# Patient Record
Sex: Female | Born: 1935 | Race: White | Hispanic: No | Smoking: Never smoker
Health system: Southern US, Community
[De-identification: ages and names within clinical notes are randomized; demographics above are authoritative.]

## PROBLEM LIST (undated history)

## (undated) DIAGNOSIS — I10 Essential (primary) hypertension: Secondary | ICD-10-CM

## (undated) DIAGNOSIS — F32A Depression, unspecified: Secondary | ICD-10-CM

## (undated) DIAGNOSIS — F329 Major depressive disorder, single episode, unspecified: Secondary | ICD-10-CM

## (undated) HISTORY — PX: CHOLECYSTECTOMY: SHX55

## (undated) HISTORY — PX: ABDOMINAL HYSTERECTOMY: SHX81

## (undated) HISTORY — PX: DILATION AND CURETTAGE OF UTERUS: SHX78

## (undated) HISTORY — PX: THROAT SURGERY: SHX803

---

## 2007-11-15 ENCOUNTER — Encounter: Admission: RE | Admit: 2007-11-15 | Discharge: 2007-11-15 | Payer: Self-pay | Admitting: Orthopedic Surgery

## 2008-05-26 ENCOUNTER — Encounter: Admission: RE | Admit: 2008-05-26 | Discharge: 2008-07-27 | Payer: Self-pay | Admitting: Orthopedic Surgery

## 2008-07-27 ENCOUNTER — Inpatient Hospital Stay (HOSPITAL_COMMUNITY): Admission: RE | Admit: 2008-07-27 | Discharge: 2008-07-31 | Payer: Self-pay | Admitting: Orthopedic Surgery

## 2009-04-13 ENCOUNTER — Inpatient Hospital Stay (HOSPITAL_COMMUNITY): Admission: RE | Admit: 2009-04-13 | Discharge: 2009-04-16 | Payer: Self-pay | Admitting: Orthopedic Surgery

## 2010-09-16 ENCOUNTER — Encounter
Admission: RE | Admit: 2010-09-16 | Discharge: 2010-09-16 | Payer: Self-pay | Source: Home / Self Care | Attending: Orthopedic Surgery | Admitting: Orthopedic Surgery

## 2010-09-30 ENCOUNTER — Encounter
Admission: RE | Admit: 2010-09-30 | Discharge: 2010-09-30 | Payer: Self-pay | Source: Home / Self Care | Attending: Orthopedic Surgery | Admitting: Orthopedic Surgery

## 2010-10-11 ENCOUNTER — Encounter
Admission: RE | Admit: 2010-10-11 | Discharge: 2010-11-08 | Payer: Self-pay | Source: Home / Self Care | Attending: Orthopedic Surgery | Admitting: Orthopedic Surgery

## 2010-11-10 ENCOUNTER — Ambulatory Visit: Payer: Medicare Other | Admitting: Physical Therapy

## 2010-11-10 ENCOUNTER — Ambulatory Visit: Payer: Medicare Other | Attending: Orthopedic Surgery | Admitting: Physical Therapy

## 2010-11-10 DIAGNOSIS — M6281 Muscle weakness (generalized): Secondary | ICD-10-CM | POA: Insufficient documentation

## 2010-11-10 DIAGNOSIS — IMO0001 Reserved for inherently not codable concepts without codable children: Secondary | ICD-10-CM | POA: Insufficient documentation

## 2010-11-10 DIAGNOSIS — M25539 Pain in unspecified wrist: Secondary | ICD-10-CM | POA: Insufficient documentation

## 2010-11-10 DIAGNOSIS — M2560 Stiffness of unspecified joint, not elsewhere classified: Secondary | ICD-10-CM | POA: Insufficient documentation

## 2010-11-15 ENCOUNTER — Ambulatory Visit: Payer: Medicare Other | Admitting: Physical Therapy

## 2010-11-18 ENCOUNTER — Ambulatory Visit: Payer: Medicare Other | Admitting: Physical Therapy

## 2010-11-22 ENCOUNTER — Ambulatory Visit: Payer: Medicare Other | Admitting: Physical Therapy

## 2011-01-15 LAB — CBC
Hemoglobin: 9.9 g/dL — ABNORMAL LOW (ref 12.0–15.0)
MCHC: 32.3 g/dL (ref 30.0–36.0)
Platelets: 126 10*3/uL — ABNORMAL LOW (ref 150–400)
Platelets: 162 10*3/uL (ref 150–400)
RDW: 14.8 % (ref 11.5–15.5)
RDW: 14.9 % (ref 11.5–15.5)

## 2011-01-15 LAB — TYPE AND SCREEN: ABO/RH(D): A NEG

## 2011-01-15 LAB — BASIC METABOLIC PANEL
BUN: 14 mg/dL (ref 6–23)
BUN: 15 mg/dL (ref 6–23)
CO2: 26 mEq/L (ref 19–32)
Calcium: 8.3 mg/dL — ABNORMAL LOW (ref 8.4–10.5)
Calcium: 8.6 mg/dL (ref 8.4–10.5)
GFR calc non Af Amer: 37 mL/min — ABNORMAL LOW (ref >60)
Glucose, Bld: 107 mg/dL — ABNORMAL HIGH (ref 70–99)
Glucose, Bld: 84 mg/dL (ref 70–99)
Sodium: 137 mEq/L (ref 135–145)
Sodium: 138 mEq/L (ref 135–145)

## 2011-01-16 LAB — DIFFERENTIAL
Basophils Absolute: 0.1 10*3/uL (ref 0.0–0.1)
Lymphocytes Relative: 26 % (ref 12–46)
Monocytes Absolute: 0.4 10*3/uL (ref 0.1–1.0)
Monocytes Relative: 5 % (ref 3–12)
Neutro Abs: 4.9 10*3/uL (ref 1.7–7.7)

## 2011-01-16 LAB — URINALYSIS, ROUTINE W REFLEX MICROSCOPIC
Nitrite: NEGATIVE
Specific Gravity, Urine: 1.002 — ABNORMAL LOW (ref 1.005–1.030)
Urobilinogen, UA: 0.2 mg/dL (ref 0.0–1.0)

## 2011-01-16 LAB — BASIC METABOLIC PANEL
Calcium: 9.5 mg/dL (ref 8.4–10.5)
GFR calc Af Amer: 43 mL/min — ABNORMAL LOW (ref >60)
GFR calc non Af Amer: 36 mL/min — ABNORMAL LOW (ref >60)
Sodium: 138 mEq/L (ref 135–145)

## 2011-01-16 LAB — CBC
Hemoglobin: 12.5 g/dL (ref 12.0–15.0)
RBC: 3.96 MIL/uL (ref 3.87–5.11)
RDW: 14.9 % (ref 11.5–15.5)

## 2011-01-16 LAB — APTT: aPTT: 29 seconds (ref 24–37)

## 2011-02-21 NOTE — Op Note (Signed)
Carla [name], Carla [name]                ACCOUNT NO.:  [id number]   MEDICAL RECORD NO.:  [id number]          PATIENT TYPE:  INP   LOCATION:  0009                         FACILITY:  [facility]   PHYSICIAN:  Madlyn Frankel. Charlann Boxer, M.D.  DATE OF BIRTH:  [birth year]   DATE OF PROCEDURE:  07/27/2008  DATE OF DISCHARGE:                               OPERATIVE REPORT   PREOPERATIVE DIAGNOSIS:  Right knee osteoarthritis.   POSTOPERATIVE DIAGNOSIS:  Right knee osteoarthritis.   PROCEDURE:  Right total knee replacement utilizing the DePuy rotating  platform posterior stabilized knee system size 3 femur, 3 tibia, a 12.5  mm insert and a 35 patellar button.   SURGEON:  Madlyn Frankel. Charlann Boxer, M.D.   ASSISTANT:  Yetta Glassman. Loreta Ave, PA.   ANESTHESIA:  Spinal.   DRAINS:  Times one.   COMPLICATIONS:  None.   SPECIMENS:  None.   FINDINGS:  None.   TOURNIQUET TIME:  34 minutes at 250 mmHg.   INDICATIONS FOR THE PROCEDURE:  Ms. Bacha is a 75 year old female who I  have been following in the office over the past year or so with  bilateral knee osteoarthritis, right symptomatically worse than left.  She had failed conservative measures including injections, viscus  supplementation.  At this point she had enough of a reduction of quality  of life that she decided to opt for arthroplasty.  We reviewed the  risks, benefits, pros and cons of this procedure had set her up for some  preoperative physical therapy to assist in her overall recovery.  The  risks of infection, DVT, component failure, stiffness were all discussed  and the need for revision surgery was also reviewed.  Postoperative  course expectations discussed.  Consent was obtained for benefit of pain  relief.   PROCEDURE IN DETAIL:  The patient was brought to operative theater.  Once adequate anesthesia, preoperative antibiotics, Ancef, administered  the patient was positioned supine with a right thigh tourniquet placed.  The right lower extremity was  pre-scrubbed, prepped and draped in  sterile fashion.  A time-out was carried out identifying the proper  patient and extremity.  Leg was exsanguinated, tourniquet elevated.  A  midline incision was made.  A median arthrotomy was created and  following initial debridement attention was directed to the patella.  Precut measure was 22 mm.  I resected down to 13 and 14.  I used a 35  patellar button.  The patient had a large osteophyte on the lateral  aspect of the patella which was debrided back to stable level.   We kept the patella button in place to protect the cut surface for the  remainder of the case.   Attention was now directed to the femur.  Femoral canal was opened.  Irrigated to prevent fat emboli.  The intramedullary rod was placed and  at the 3 degrees of valgus I resected 10 mm of bone off the distal  femur.   Following this cut, attention was directed to the tibia.  The tibia was  subluxated anteriorly and extramedullary rod placed.  I removed the  meniscus and the cruciate stumps.  I resected 10 mm of bone off the  proximal tibia based off the lateral side.  Following this I checked  with an extension block and determined that I was going to be used a  12.5 insert which got out to full extension.  I removed the pins holding  the extramedullary guide in place and now attended back to the femur.  We checked the cut surface of tibia to assure that it was perpendicular  in both planes.  Once this was confirmed I sized the femur to be a size  3, placed the rotation block and then pinned it in position based on the  rotation set by the cut surface of the proximal tibia using the  appropriate guide.  I then pinned the 4-in-1 cutting block in place,  checked to make sure there was going to be no notching.  The anterior,  posterior and chamfer cuts were then made without difficulty.  The box  cut was then made off the lateral aspect of the distal femur.   Attention was now  directed back to the tibia.  I used a size 3 tibial  tray which sat best on the cut surface assuring there would be no early  subsidence.  It was resting mainly on cortical bone.  I drilled the  keel, punched it and did a trial reduction.  Again, we were happy with a  12.5 insert from extension to flexion without evidence of any  instability.  The patella was noted to track without application of  pressure.   At this point, all trial components were removed.  The synovial capsule  junction was injected with 60 mL of 0.25% of Marcaine with epinephrine  and 1 mL of Toradol.  The knee was irrigated with pulse lavage of normal  saline solution.  Final components opened and cement mixed.   Final components were cemented into position.  The knee was brought to  extension with 12.5 insert.  Extruded cement removed.  Once cement  cured, excessive cement was removed throughout the knee.  The final 12.5  insert to match the 3 femur was then inserted without difficulty  following irrigation.   The knee was then irrigated with pulse lavage again.  A medium Hemovac  drain was placed deep.  The extensor mechanism was then reapproximated  using #1 Vicryl with the knee in flexion.  The remaining wound was  closed in layers with 2-0 Vicryl and staples on the skin due to the very  thin nature of her epidermal layer.   The patient's knee was then cleaned, dried and dressed sterilely in a  sterile bulky wrap.  She was brought to the recovery room in stable  condition and tolerated the procedure well.      Madlyn Frankel Charlann Boxer, M.D.  Electronically Signed     MDO/MEDQ  D:  07/27/2008  T:  07/27/2008  Job:  [id number]

## 2011-02-21 NOTE — Discharge Summary (Signed)
NAMEVARNIKA, Carla [name]                ACCOUNT NO.:  [id number]   MEDICAL RECORD NO.:  [id number]          PATIENT TYPE:  INP   LOCATION:  1603                         FACILITY:  [facility]   PHYSICIAN:  Madlyn Frankel. Charlann Boxer, M.D.  DATE OF BIRTH:  11-02-1935   DATE OF ADMISSION:  07/27/2008  DATE OF DISCHARGE:  07/31/2008                               DISCHARGE SUMMARY   ADMITTING DIAGNOSES:  1. Osteoarthritis.  2. Stroke.  3. Hypertension.  4. Hypercholesteremia.  5. Chronic renal insufficiency.   DISCHARGE DIAGNOSES:  1. Osteoarthritis.  2. Stroke.  3. Hypertension.  4. Hypercholesteremia.  5. Chronic renal insufficiency.   HISTORY OF PRESENT ILLNESS:  A 75 year old female with a history of  right knee pain secondary to osteoarthritis that was refractory to all  conservative treatment.  She also has a significant history of chronic  renal insufficiency.  She has multiple food restrictions that are  extensive in nature.  Please see addendum documentation for very  specific details.   CONSULTATIONS:  None.   PROCEDURE:  Right total knee replaced by surgeon, Dr. Durene Romans,  assistant, Dwyane Luo, PA-C.   LABORATORY DATA:  CBC final reading; white blood cells 9.8, hemoglobin  9.1, hematocrit 27.1, platelets 143.  Metabolic panel tracked throughout  the course of stay, final reading - sodium 138, potassium 4.4,  creatinine 1.7, glucose 56.   Calcium, final reading 8.6.   Coags all within normal limits.  UA negative for nitrites.  There were a  few bacteria though.   Radiology not found in chart.  Cardiology not found in chart.   HOSPITAL COURSE:  The patient underwent right total knee replacement.  She has made slow progress during the course of stay.  She has remained  hemodynamically stable, although she does have chronic renal disease.  Her dressing was changed on a daily basis.  After day one, wound with no  significant drainage.  Staples were used.  She had improving quad  function during her stay and by day #4, she was able to do a straight-  leg raise with extreme effort.  Due to her slow progress, she was  agreeable to a short-term stay at a skilled nursing facility and rehab  facility.  When seen on day #3, she did have some coughing and it  persisted somewhat into day #4.  When I saw her, she was sleepy but  doing okay.  Pain was well-controlled with improved progress with  physical therapy the prior day.  She had a little bit of a low glucose,  but was corrected with just some soda and juice.   DISCHARGE DISPOSITION:  Discharge to rehab facility in stable and  improved condition.   DISCHARGE DIET:  Regular.   DISCHARGE WOUND CARE:  Keep dry.   DISCHARGE PHYSICAL THERAPY:  Weightbearing as tolerated with the use of  a rolling walker.  Goals of physical therapy will be to maximize range  of motion, increased strength, work on gait retraining and encourage  independence in activities of daily living.   DISCHARGE MEDICATIONS:  1. Lovenox 40 mg  subcu q.24 x10 days.  2. Robaxin 500 mg p.o. q.6 p.r.n. muscle spasm or pain.  3. Iron 325 mg 1 p.o. t.i.d. x3 weeks.  4. Colace 100 mg p.o. b.i.d. p.r.n. constipation while on narcotics.  5. MiraLax 17 gm p.o. daily p.r.n. constipation while on narcotics.  6. Enteric-coated aspirin 325 mg 1 p.o. daily x4 weeks after      completion of Lovenox.  7. Oxycodone 5 mg 1-2 p.o. q.3-4 p.r.n. pain.  8. Sodium bicarbonate 650 mg 2 tablets b.i.d.  9. Meclizine 25 mg p.o. b.i.d.  10.Simvastatin 20 mg p.o. nightly.  11.Allopurinol 300 mg p.o. daily.  12.Diovan 160 mg p.o. daily.  13.Furosemide 40 mg p.r.n.  14.Acetaminophen p.r.n.  15.Multivitamin p.o. daily.  16.Vitamin D3 1000 units b.i.d.  17.Vitamin C 500 mg b.i.d.  18.Vitamin B12 shot, she gets on a monthly basis.   DISCHARGE FOLLOWUP:  Follow up with Dr. Charlann Boxer at phone number [phone]  for an appointment for stable removal and wound check.      ______________________________  Yetta Glassman. Loreta Ave, [place]      Madlyn Frankel. Charlann Boxer, M.D.  Electronically Signed    BLM/MEDQ  D:  07/31/2008  T:  07/31/2008  Job:  [id number]   cc:   Dalbert Mayotte, M.D.

## 2011-02-21 NOTE — Discharge Summary (Signed)
Carla [name], Carla [name]                ACCOUNT NO.:  [id number]   MEDICAL RECORD NO.:  [id number]          PATIENT TYPE:  INP   LOCATION:  1613                         FACILITY:  [facility]   PHYSICIAN:  Madlyn Frankel. Charlann Boxer, M.D.  DATE OF BIRTH:  [birth year]   DATE OF ADMISSION:  04/13/2009  DATE OF DISCHARGE:  04/16/2009                               DISCHARGE SUMMARY   ADMISSION DIAGNOSES:  1. Osteoarthritis.  2. Stroke.  3. Hypertension.  4. Hypercholesterolemia.  5. Chronic renal insufficiency.  6. Hysterectomy.  7. Gastric bypass surgery 24 years ago.   DISCHARGE DIAGNOSES:  1. Osteoarthritis.  2. Stroke.  3. Hypertension.  4. Hypercholesterolemia.  5. Chronic renal insufficiency.  6. Hysterectomy.  7. Gastric bypass surgery 24 years ago.   LABORATORY DATA:  CBC final reading showed her white blood cell count of  7.8, hematocrit 28.6, platelets 126.  Metabolic:  Sodium 137, potassium  4.5, BUN 14, creatinine 1.4, glucose 84.   CONSULTS:  None.   PROCEDURE:  Left total knee replacement by surgeon Dr. Durene Romans,  assistant Dwyane Luo, P.A.-C.   HOSPITAL COURSE:  The patient underwent left total knee replacement and  was admitted to the orthopedic floor.  She was planned for skilled  nursing facility rehabilitation at discharge.  She did have some nausea  the first day.  We changed her pain medicine over to oxycodone.  Due to  some high potassium, we put her on normal saline.  The next day, pain  was better.  She did have a little bit of rough evening.  We maintained  her Foley due to low urinary output and gave her boluses to restabilize.  She did well with physical therapy although she had unlimited ability to  ambulate long distances, only going approximately 20 feet by the third  day.  She was making progress with improving quad function.  Dressing  was changed.  No significant drainage from the wound.  She was afebrile,  hemodynamically and orthopedically stable, ready for  discharge to  skilled nursing facility rehabilitation for further progress with  physical therapy.   DISCHARGE DISPOSITION:  Discharged to skilled nursing facility  rehabilitation in stable and improved condition.   DISCHARGE DIET:  Heart healthy.  She has extensive dietary needs.  The  patient is a former Engineer, civil (consulting), very pleasant, and she can give you full  details of her extensive dietary needs.  Basically, she has shrunken  kidneys and therefore requires small and frequent rehydration.  Again,  ask patient for details of her dietary needs, and please help her in  this area.   DISCHARGE WOUND CARE:  Keep wound dry.   DISCHARGE PHYSICAL THERAPY:  She is weight bearing as tolerated with use  of a rolling walker.  Goals of physical therapy will be 0 to 120 degrees  by 6 weeks.   DISCHARGE MEDICATIONS:  1. Oxycodone 5 mg 1-3 p.o. q.2-4 h. p.r.n. pain.  2. Tylenol 325 mg to 650 mg p.o. q.6 h. p.r.n. pain.  May be given in      conjunction with oxycodone.  3. Robaxin 500 mg 1 p.o. q.6 h. p.r.n. muscle spasm pain.  4. Iron 325 mg 1 p.o. t.i.d. x2 weeks.  5. Colace 100 mg 1 p.o. b.i.d. p.r.n. pain.  6. MiraLax 17 g p.o. daily p.r.n. pain.  7. Lovenox 40 mg subcutaneous q.24 h. for 10 days.  8. Start enteric-coated aspirin 325 mg 1 p.o. daily after Lovenox      completed.  This is for 4 weeks.  9. Meclizine 25 mg p.o. b.i.d.  10.Simvastatin 20 mg p.o. every night.  11.Diovan 160 mg p.o. every night.  12.Allopurinol 300 mg p.o. every night.  13.Furosemide 40 mg p.r.n.  14.Aspirin 81 mg, hold, see note on other aspirin; he may resume the      81-mg aspirin after done with the 325.  15.Vitamin D 3000 units b.i.d.  16.Multivitamin 2 tablets chewable twice a day.  17.Vitamin C chewable 500 mg 2 tablets twice a day.   DISCHARGE FOLLOWUP:  Follow up with Dr. Charlann Boxer at phone number [phone] in  2 weeks for wound check.     ______________________________  Yetta Glassman. Loreta Ave, [place]       Madlyn Frankel. Charlann Boxer, M.D.  Electronically Signed    BLM/MEDQ  D:  04/16/2009  T:  04/16/2009  Job:  [id number]

## 2011-02-21 NOTE — H&P (Signed)
[name], [name]                ACCOUNT NO.:  [id number]   MEDICAL RECORD NO.:  [id number]          PATIENT TYPE:  INP   LOCATION:                               [facility]ACILITY:  [facility]   PHYSICIAN:  Madlyn Frankel. Charlann Boxer, M.D.  DATE OF BIRTH:  1936/01/09   DATE OF ADMISSION:  07/27/2008  DATE OF DISCHARGE:                              HISTORY & PHYSICAL   PROCEDURE:  Right total knee replacement.   CHIEF COMPLAINT:  Right knee pain.   HISTORY OF PRESENT ILLNESS:  A 75 year old female with a history of  right knee pain secondary to osteoarthritis.  It has been refractory to  all conservative treatment.  She also has a history of chronic renal  insufficiency.  She has multiple food restrictions that are extensive in  nature.  Please see documentation for the very specific details.  She  had been presurgically assessed by her primary care physician, Dr.  Dalbert Mayotte.   Her nephrologist is Dr. Jeneen Montgomery, but she has not seen him in 2 years.   PAST MEDICAL HISTORY:  1. Osteoarthritis.  2. Stroke.  3. Hypertension.  4. Hypercholesteremia.  5. Chronic renal insufficiency.   PAST SURGICAL HISTORY:  1. D and C.  2. Bladder repairs.  3. Hysterectomy.  4. She had a previous vaginal rectal fistula repaired multiple times.  5. Gastric bypass surgery 24 years ago.  6. Right foot surgery.   FAMILY HISTORY:  Alzheimer's, cancer and heart disease.   SOCIAL HISTORY:  Divorced, nonsmoker.  Lives alone, but has help lined  up from church for postoperative home health care.   DRUG ALLERGIES:  COMPAZINE.   MEDICATIONS:  1. Aspirin 81 mg daily.  2. Meclizine 25 mg daily.  3. Diovan 160 mg p.o. daily.  4. Simvastatin 20 mg p.o. daily.  5. Allopurinol 300 mg 1 p.o. daily.  6. Zyrtec 10 mg p.o. daily if needed.  7. Vitamin C daily.  8. Multivitamin daily.  9. Vitamin D 4000 units daily.   REVIEW OF SYSTEMS:  HEENT/NEURO:  She has hearing loss, intermittent  insomnia.  Does not wear dentures.   GASTROINTESTINAL:  Diarrhea  sometimes due to previous gastric bypass stomach stapling.  GENITOURINARY:  She has difficulty with increased urination at night.  She has to drink lots of fluids because of kidneys.  MUSCULOSKELETAL:  She has multiple joint pains, muscular pain, back  pain.  She goes to Dr. Patria Mane for pain management.   DIET:  Multiple diet restrictions, extensive in nature.  Please see  documentation provided by the patient, as well as allowing the patient  to bring in her own foods.  Otherwise see HPI.   PHYSICAL EXAMINATION:  VITAL SIGNS:  Pulse 72, respirations 16, blood  pressure 150/100.  GENERAL:  Awake, alert and oriented, well-developed, well-nourished.  NECK:  Supple.  No carotid bruits.  CHEST/LUNGS:  Clear to auscultation bilaterally.  BREASTS:  Deferred.  HEART:  Regular rate and rhythm.  S1-S2 distinct.  ABDOMEN:  Soft, nontender, nondistended.  Bowel sounds present.  GENITOURINARY:  Deferred.  EXTREMITIES:  Right knee has  increased pain with range of motion.  SKIN:  No cellulitis.  NEUROLOGIC:  Intact distal sensibilities.   LABORATORY DATA:  CBC with diff on June 08, 2008, white blood cells 9,  hemoglobin 11.6, hematocrit 38.4, platelets 248.  Metabolic panel on  June 08, 2008, sodium 141, potassium 5.8, BUN 24, creatinine 2.   Cardiology; EKG shows sinus rhythm with occasional PAC, all within  normal limits.   IMPRESSION:  Right knee osteoarthritis.   PLAN OF ACTION:  Right total knee replacement at [facility],  July 27, 2008, by surgeon Dr. Lajoyce Corners.  The risks and complications  were discussed.   Postoperative medications were provided at time of history and physical,  including Lovenox, Robaxin, iron, aspirin, Colace and MiraLax.   As noted about her diet and allow the patient bring her own food,  popsicles, whatever it is that she needs in order to maintain proper  hydration, as well as proper nutrition.      ______________________________  [name], [place]      Madlyn Frankel. Charlann Boxer, M.D.  Electronically Signed    BLM/MEDQ  D:  07/22/2008  T:  07/22/2008  Job:  [id number]   cc:   Dalbert Mayotte, M.D.

## 2011-02-21 NOTE — H&P (Signed)
Carla [name], Carla [name]                ACCOUNT NO.:  [id number]   MEDICAL RECORD NO.:  [id number]          PATIENT TYPE:  INP   LOCATION:  NA                           FACILITY:  [facility]   PHYSICIAN:  Madlyn Frankel. Charlann Boxer, M.D.  DATE OF BIRTH:  [birth year]   DATE OF ADMISSION:  04/13/2009  DATE OF DISCHARGE:                              HISTORY & PHYSICAL   PROCEDURE:  Left total knee replacement.   CHIEF COMPLAINT:  Left knee pain.   HISTORY OF PRESENT ILLNESS:  A 75 year old female with a history of left  knee pain secondary to osteoarthritis.  It has been refractory to all  conservative treatment.  She has had a recent history in 2009, of a  right total knee replacement and has done very well.  She does have a  significant history of a stroke in the past with some left-sided  weakness.   PAST MEDICAL HISTORY:  1. Osteoarthritis.  2. Stroke.  3. Hypertension.  4. Hypercholesterolemia.  5. Chronic renal insufficiency.   PAST SURGICAL HISTORY:  1. Right total knee replacement.  2. Multiple D and Cs.  3. Bladder repairs.  4. Hysterectomy.  5. Vaginal rectal fistula repaired multiple times.  6. Gastric bypass surgery 24 years ago.  7. Right foot surgery repair.   FAMILY HISTORY:  Alzheimer's, cancer and heart disease.   SOCIAL HISTORY:  Divorced, nonsmoker.  Lives alone.  She will require a  rehab stay postoperatively.  She selected [facility]  in [place].  She already has a bed and is preadmitted.   DRUG ALLERGIES:  COMPAZINE.   CURRENT MEDICATIONS:  1. Aspirin 81 mg p.o. daily.  2. Meclizine 25 mg p.o. daily.  3. Diovan 80 mg 1 p.o. daily.  4. Simvastatin 20 mg 1 p.o. daily.  5. Allopurinol 200 mg 1 p.o. daily.  6. Zyrtec 10 mg p.o. daily  7. Furosemide 40 mg 2 a day.  8. Acetaminophen 1000 mg p.o. b.i.d.  9. Multivitamins chewable 2 p.o. daily.  10.Vitamin C chewables 2 a day.  11  Vitamin D 4000 units daily.  1. The patient does require some pills  to halved as she has difficulty      swallowing them.   DISCHARGED DIET SPECIAL NEEDS:  The patient has special diet needs.  Please see information in chart.   REVIEW OF SYSTEMS:  HEENT:  She has hearing loss, intermittent insomnia.  Does not wear dentures.  GASTROINTESTINAL:  She has diarrhea sometimes  due to previous gastric bypass stomach stapling.  GENITOURINARY:  She has had difficulty with increased urination at  night, has to drink lots of fluids because of her kidneys.  MUSCULOSKELETAL:  She has multiple joint pains, muscular pain and back  pain.  Otherwise see HPI.   PHYSICAL EXAMINATION:  VITAL SIGNS:  Pulse 72, respirations 16, blood  pressure 154/80.  GENERAL:  Awake, alert and oriented.  HEENT:  Normocephalic.  NECK:  Supple.  No carotid bruits.  CHEST:  Lungs clear to auscultation bilaterally.  BREASTS:  Deferred.  HEART:  S1-S2 distinct.  ABDOMEN:  Soft, nontender.  Bowel sounds present.  PELVIS:  Stable.  EXTREMITIES:  Right knee, she does have a total knee.  Her range of  motion is 0-120.  Left knee, painful with range of motion and  weightbearing, no flexion contracture.  SKIN:  No cellulitis left knee.  NEUROLOGIC:  Intact distal sensibilities of left lower extremity.  She  does have decreased strength versus right.   LABORATORY DATA:  CBC:  White blood cell count 5.5, hemoglobin 11.4,  hematocrit 35.9, platelets 234.  White cell differential, no significant  abnormalities.  Metabolic panel:  Sodium 142, potassium 4.8, BUN 113,  creatinine 1.42.  Glucose was 77.  PT/INR 14.2 and 1.1 respectively.  PTT was 32.  Urinalysis showed many bacteria.  She was treated with  Cipro.  Chest x-ray and EKG pending.   IMPRESSION:  Left knee osteoarthritis.   PLAN OF ACTION:  Left total knee replacement, Dr. Charlann Boxer at [facility] on  April 13, 2009.  The risks and complications were discussed.   The patient is planning to stay in a rehab facility postoperatively and  has  selected [facility] in [place].  She has  already been preadmitted.  They have all of her information.   DISCHARGE SPECIAL INSTRUCTIONS:  Please see the chart with additional  documentation and information provided by the patient about her  extensive dietary needs, as well as some other matters concerning her  medicine administration.     ______________________________  [name], [place]      Madlyn Frankel. Charlann Boxer, M.D.  Electronically Signed    BLM/MEDQ  D:  04/02/2009  T:  04/02/2009  Job:  [id number]

## 2011-02-21 NOTE — Op Note (Signed)
[name], [name]                ACCOUNT NO.:  [id number]   MEDICAL RECORD NO.:  [id number]          PATIENT TYPE:  INP   LOCATION:  0002                         FACILITY:  [facility]   PHYSICIAN:  Madlyn Frankel. Charlann Boxer, M.D.  DATE OF BIRTH:  10/22/1935   DATE OF PROCEDURE:  04/13/2009  DATE OF DISCHARGE:                               OPERATIVE REPORT   PREOPERATIVE DIAGNOSIS:  Left knee osteoarthritis.   POSTOPERATIVE DIAGNOSIS:  Left knee osteoarthritis.   PROCEDURE:  Left total knee replacement.   COMPONENTS USED:  DePuy Rotating Platform Posterior Stabilized Knee  System with size 3 femur, 3 tibia, 15-mm insert and a 35 patellar  button.   SURGEON:  Madlyn Frankel. Charlann Boxer, M.D.   ASSISTANT:  Yetta Glassman. Mann, PA   ANESTHESIA:  Duramorph spinal.   TOURNIQUET TIME:  32 minutes at 250 mmHg.   DRAINS:  One Hemovac.   COMPLICATIONS:  None.   SPECIMENS:  None.   INDICATIONS FOR THE PROCEDURE:  Carla [name] is a 75 year old patient of  mine with history of right total knee replacement that she has done real  well from.  She,  at this point, has persistent discomfort in the left  knee that was not responsive to conservative measures.  She, at this  point, wishes to proceed with arthroplasty.  We reviewed the risks and  benefits, postop course and expectations.  Consent was obtained for this  procedure.   PROCEDURE IN DETAIL:  The patient was brought to operative theater.  Once adequate anesthesia and preoperative antibiotics, Ancef 2 grams,  administered, the patient was positioned supine with a thigh tourniquet  placed.  Left lower extremity was prescrubbed, prepped and draped in  sterile fashion.  We used the [facility] leg holder.  The foot was placed  into the holding device.   Following a time-out, the patient's extremity was exsanguinated,  tourniquet elevated to 250 mmHg.   With the knee flexed, a midline incision was made.  An arthrotomy was  created and following initial  debridement, attention was directed to the  patella.  Precut measure was noted to be about 21 mm and I only cut down  to about 14 mm and used a 35 patellar button after debriding some of the  lateral facet osteophytes.   The metal shim was placed on the cut surface of the patella to protect  it from the saw blade and retractors.   Attention was now directed the femur.  Femoral canal was opened with a  drill, irrigated to prevent fat emboli.  An intramedullary rod was  passed and at 3 degrees of valgus, I resected 10 mm of bone off the  distal femur.   Attention was then directed tibia with the tibia was carefully  subluxated anteriorly.  Extramedullary guide was utilized.  I resected  based on the appearance of the proximal tibia about 8 mm off the  proximal lateral tibia.  When I checked with the extension block, the  knee came to at least extension with 10 mm block in place.   I then checked the cut  surface and confirmed was perpendicular in the  coronal plane.  Once this was done I went back to the femur, sized the  femur to be a size 3, which matched the contralateral extremity.  I then  set rotation using the C-clamp off the proximal tibial cut.   Once the rotation block was set, I checked to make sure there was going  to be no notching and actually dropped it down 2 mm to help with flexion  gap.  The anterior, posterior and chamfer cuts were then made without  difficulty, no notching.  The box cut was then made based off the  lateral aspect of distal femur.   The tibia was subluxed anteriorly.  The cut surface appeared to fit best  with size 3 tibial tray, it was pinned into position through the medial  third of tubercle, drilled and keel punched.  Trial reduction was  carried out.  Based on the initial assessment with the extension block I  felt that a 12.5 would be a good trial, even here there was a little bit  of hyperextension.  She was noted be a little bit loose to  begin with.   All trial components were removed.  The synovial/capsule junction was  injected with 0.25% Marcaine with epinephrine and 1 cc of Toradol.  The  knee was irrigated with normal saline solution.  Final components were  opened and cement mixed.  Once the cut surfaces were prepared and dried,  the final components were cemented into position with 3 tibia, 3 femur.  Initially a 12.5 insert was utilized and the knee was brought to  extension.  Extruded cement was removed.  Once the cement was dried and  excessive cement was removed, I retrialed with a 15 and felt that the  knee came to full extension with stable ligaments from extension to  flexion.  I chose this as my final insert and the final 15 insert to  match the 3 femur was inserted.  A medium Hemovac drain was placed deep.  We irrigated the knee with normal saline solution and pulse lavage.  Tourniquet was let down at 32 minutes.  The extensor mechanism was then  reapproximated over Hemovac drain using a #1 Vicryl with the knee in  flexion.  The remainder of the wound was closed with 2-0 Vicryl and  staples again on the skin due to fact that she had a thin skin.  The  knee was then dressed with sterile bulky Jones dressing.  She was  brought to recovery room in stable condition, tolerating the procedure  well.      Madlyn Frankel Charlann Boxer, M.D.  Electronically Signed     MDO/MEDQ  D:  04/13/2009  T:  04/13/2009  Job:  [id number]

## 2011-07-11 LAB — GLUCOSE, CAPILLARY: Glucose-Capillary: 88

## 2011-07-11 LAB — DIFFERENTIAL
Basophils Absolute: 0.1
Eosinophils Absolute: 0.4
Eosinophils Relative: 4
Monocytes Absolute: 0.6
Neutrophils Relative %: 67

## 2011-07-11 LAB — BASIC METABOLIC PANEL
BUN: 13
BUN: 16
CO2: 24
CO2: 27
CO2: 28
Calcium: 7.8 — ABNORMAL LOW
Calcium: 8.1 — ABNORMAL LOW
Calcium: 8.8
Chloride: 103
Creatinine, Ser: 1.62 — ABNORMAL HIGH
Creatinine, Ser: 1.66 — ABNORMAL HIGH
GFR calc Af Amer: 36 — ABNORMAL LOW
GFR calc Af Amer: 37 — ABNORMAL LOW
GFR calc Af Amer: 38 — ABNORMAL LOW
GFR calc non Af Amer: 30 — ABNORMAL LOW
Glucose, Bld: 109 — ABNORMAL HIGH
Glucose, Bld: 56 — ABNORMAL LOW
Glucose, Bld: 92
Potassium: 4.4
Potassium: 4.4
Sodium: 136
Sodium: 136
Sodium: 138

## 2011-07-11 LAB — URINALYSIS, ROUTINE W REFLEX MICROSCOPIC
Bilirubin Urine: NEGATIVE
Glucose, UA: NEGATIVE
Hgb urine dipstick: NEGATIVE
Protein, ur: NEGATIVE
Urobilinogen, UA: 1

## 2011-07-11 LAB — CBC
Hemoglobin: 9.1 — ABNORMAL LOW
MCHC: 32.3
MCHC: 33.3
Platelets: 148 — ABNORMAL LOW
RBC: 2.71 — ABNORMAL LOW
RBC: 2.77 — ABNORMAL LOW
RBC: 4.07
RDW: 14.5
RDW: 15.2
WBC: 9.8

## 2011-07-11 LAB — PROTIME-INR
INR: 1.1
Prothrombin Time: 14.2

## 2011-07-11 LAB — TYPE AND SCREEN: ABO/RH(D): A NEG

## 2012-03-09 IMAGING — CR DG WRIST 2V*R*
2 series · 2 of 2 positions shown · non-contrast
Comparison: [HOSPITAL] [HOSPITAL] right wrist radiographs
09/16/2010.

CLINICAL DATA: Cast for distal right radial fracture

RIGHT WRIST - 2 VIEW

[view not recorded (1 of 2)]
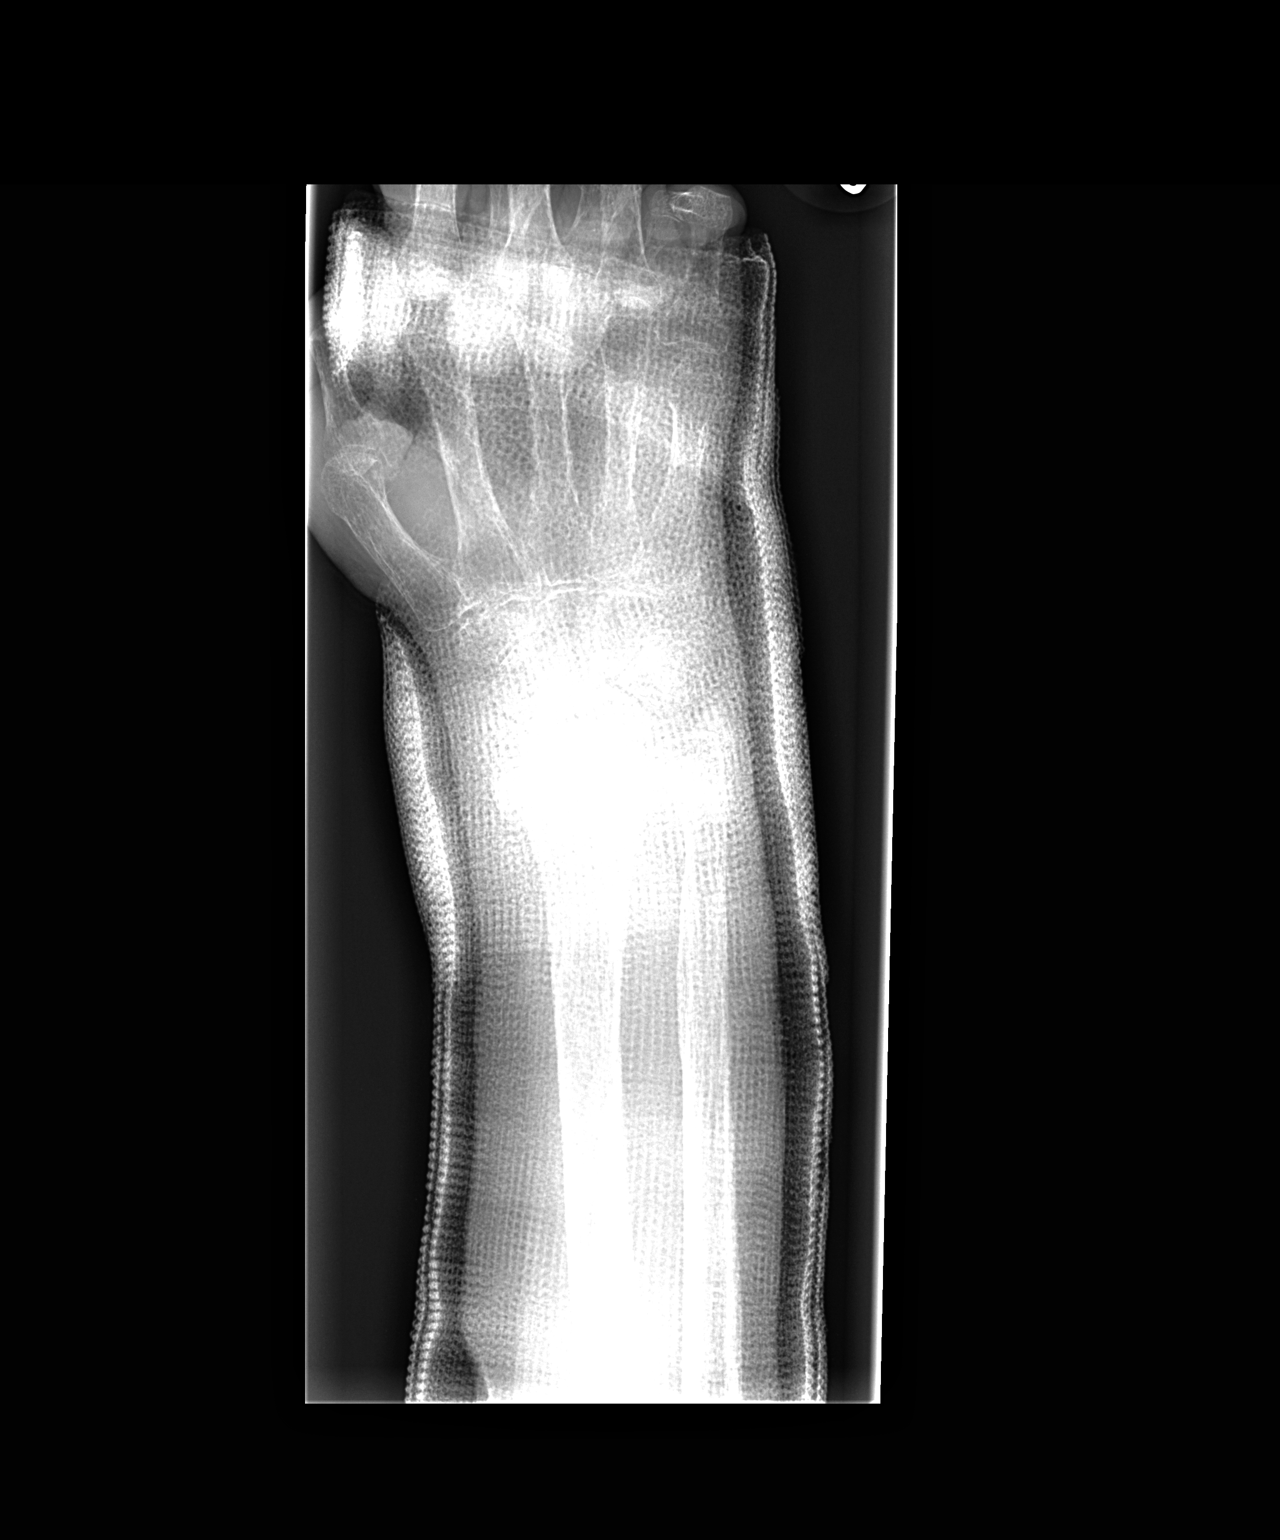

[view not recorded (2 of 2)]
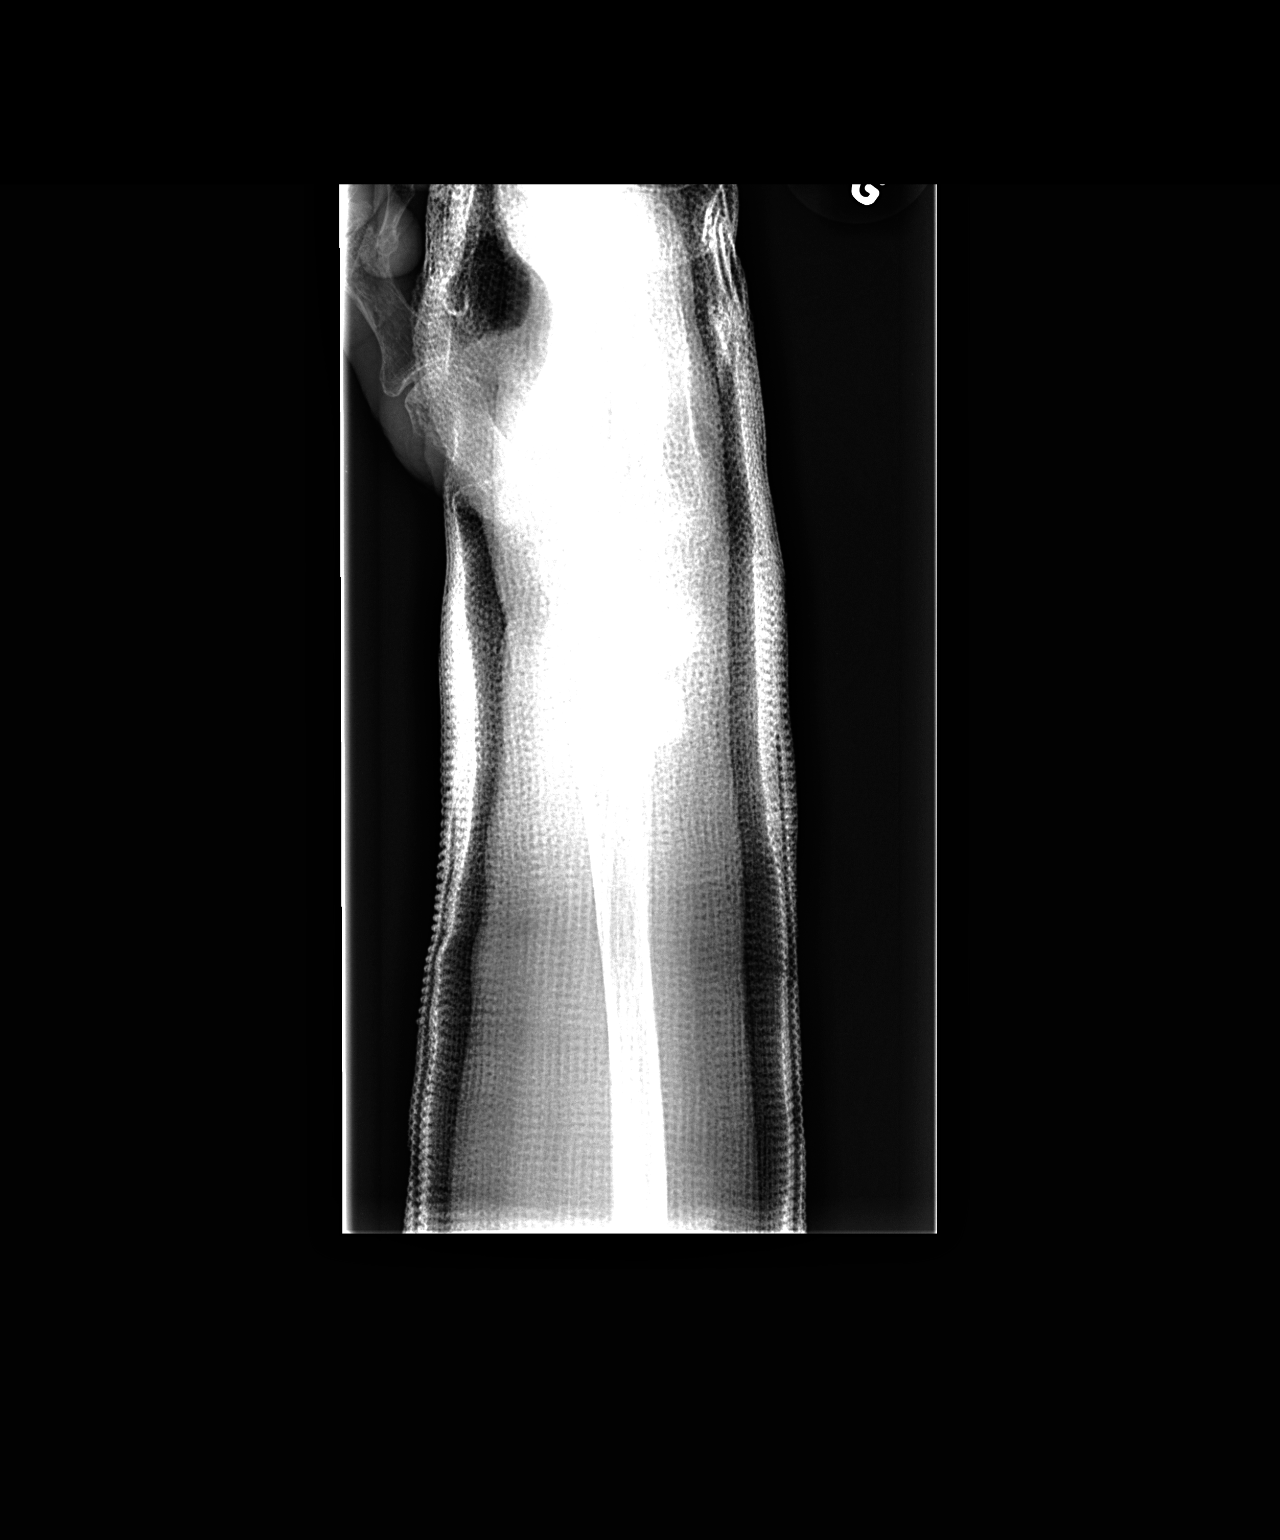

[2 of 2 positions shown; findings below may reference images not displayed]

FINDINGS: Nondisplaced transverse fractures distal right radius and
ulna with healing bridging callus seen at fracture sites.  Cast
obscures fine bony detail.
IMPRESSION: 1.  Nondisplaced transverse fractures distal right radius and ulna
with healing callus at fracture sites.
2.  Cast obscures fine bony detail.

## 2015-07-22 ENCOUNTER — Inpatient Hospital Stay (HOSPITAL_COMMUNITY): Payer: Medicare Other | Admitting: Anesthesiology

## 2015-07-22 ENCOUNTER — Encounter (HOSPITAL_COMMUNITY): Payer: Self-pay | Admitting: *Deleted

## 2015-07-22 ENCOUNTER — Other Ambulatory Visit: Payer: Self-pay

## 2015-07-22 ENCOUNTER — Inpatient Hospital Stay (HOSPITAL_COMMUNITY): Payer: Medicare Other

## 2015-07-22 ENCOUNTER — Encounter (HOSPITAL_COMMUNITY)
Admission: AD | Disposition: A | Discharge status: Skilled Nursing Facility | Payer: Self-pay | Source: Other Acute Inpatient Hospital | Attending: Orthopedic Surgery

## 2015-07-22 ENCOUNTER — Inpatient Hospital Stay (HOSPITAL_COMMUNITY)
Admission: AD | Admit: 2015-07-22 | Discharge: 2015-07-26 | DRG: 481 | Disposition: A | Discharge status: Skilled Nursing Facility | Payer: Medicare Other | Source: Other Acute Inpatient Hospital | Attending: Orthopedic Surgery | Admitting: Orthopedic Surgery

## 2015-07-22 DIAGNOSIS — M9711XA Periprosthetic fracture around internal prosthetic right knee joint, initial encounter: Principal | ICD-10-CM

## 2015-07-22 DIAGNOSIS — I10 Essential (primary) hypertension: Secondary | ICD-10-CM | POA: Diagnosis present

## 2015-07-22 DIAGNOSIS — E669 Obesity, unspecified: Secondary | ICD-10-CM | POA: Diagnosis present

## 2015-07-22 DIAGNOSIS — W19XXXA Unspecified fall, initial encounter: Secondary | ICD-10-CM | POA: Diagnosis present

## 2015-07-22 DIAGNOSIS — Z96653 Presence of artificial knee joint, bilateral: Secondary | ICD-10-CM | POA: Diagnosis present

## 2015-07-22 DIAGNOSIS — Z6835 Body mass index (BMI) 35.0-35.9, adult: Secondary | ICD-10-CM | POA: Diagnosis not present

## 2015-07-22 DIAGNOSIS — R600 Localized edema: Secondary | ICD-10-CM | POA: Diagnosis not present

## 2015-07-22 DIAGNOSIS — Z419 Encounter for procedure for purposes other than remedying health state, unspecified: Secondary | ICD-10-CM

## 2015-07-22 DIAGNOSIS — M109 Gout, unspecified: Secondary | ICD-10-CM | POA: Diagnosis not present

## 2015-07-22 DIAGNOSIS — D62 Acute posthemorrhagic anemia: Secondary | ICD-10-CM | POA: Diagnosis not present

## 2015-07-22 DIAGNOSIS — M810 Age-related osteoporosis without current pathological fracture: Secondary | ICD-10-CM | POA: Diagnosis present

## 2015-07-22 DIAGNOSIS — M25561 Pain in right knee: Secondary | ICD-10-CM

## 2015-07-22 HISTORY — PX: FEMUR IM NAIL: SHX1597

## 2015-07-22 HISTORY — DX: Depression, unspecified: F32.A

## 2015-07-22 HISTORY — DX: Essential (primary) hypertension: I10

## 2015-07-22 HISTORY — DX: Major depressive disorder, single episode, unspecified: F32.9

## 2015-07-22 LAB — CBC
HCT: 25.8 % — ABNORMAL LOW (ref 36.0–46.0)
Hemoglobin: 8.2 g/dL — ABNORMAL LOW (ref 12.0–15.0)
MCH: 32 pg (ref 26.0–34.0)
MCHC: 31.8 g/dL (ref 30.0–36.0)
MCV: 100.8 fL — ABNORMAL HIGH (ref 78.0–100.0)
PLATELETS: 125 10*3/uL — AB (ref 150–400)
RBC: 2.56 MIL/uL — ABNORMAL LOW (ref 3.87–5.11)
RDW: 15.5 % (ref 11.5–15.5)
WBC: 8.9 10*3/uL (ref 4.0–10.5)

## 2015-07-22 LAB — BASIC METABOLIC PANEL
Anion gap: 8 (ref 5–15)
BUN: 13 mg/dL (ref 6–20)
CALCIUM: 7.8 mg/dL — AB (ref 8.9–10.3)
CO2: 26 mmol/L (ref 22–32)
CREATININE: 1.3 mg/dL — AB (ref 0.44–1.00)
Chloride: 105 mmol/L (ref 101–111)
GFR calc Af Amer: 45 mL/min — ABNORMAL LOW (ref >60)
GFR, EST NON AFRICAN AMERICAN: 39 mL/min — AB (ref >60)
Glucose, Bld: 111 mg/dL — ABNORMAL HIGH (ref 65–99)
Potassium: 4 mmol/L (ref 3.5–5.1)
SODIUM: 139 mmol/L (ref 135–145)

## 2015-07-22 LAB — PROTIME-INR
INR: 1.15 (ref 0.00–1.49)
PROTHROMBIN TIME: 14.8 s (ref 11.6–15.2)

## 2015-07-22 LAB — SURGICAL PCR SCREEN
MRSA, PCR: NEGATIVE
Staphylococcus aureus: NEGATIVE

## 2015-07-22 LAB — PREPARE RBC (CROSSMATCH)

## 2015-07-22 SURGERY — INSERTION, INTRAMEDULLARY ROD, FEMUR, RETROGRADE
Anesthesia: General | Site: Leg Upper | Laterality: Right

## 2015-07-22 MED ORDER — CEFAZOLIN SODIUM-DEXTROSE 2-3 GM-% IV SOLR
INTRAVENOUS | Status: DC | PRN
  Administered 2015-07-22: 2 g via INTRAVENOUS

## 2015-07-22 MED ORDER — CEFAZOLIN SODIUM-DEXTROSE 2-3 GM-% IV SOLR
INTRAVENOUS | Status: AC
  Filled 2015-07-22: qty 50

## 2015-07-22 MED ORDER — SODIUM CHLORIDE 0.9 % IV BOLUS (SEPSIS)
500.0000 mL | Freq: Once | INTRAVENOUS | Status: AC
  Administered 2015-07-22: 500 mL via INTRAVENOUS

## 2015-07-22 MED ORDER — PROPOFOL 10 MG/ML IV BOLUS
INTRAVENOUS | Status: AC
Start: 2015-07-22 — End: 2015-07-22
  Filled 2015-07-22: qty 20

## 2015-07-22 MED ORDER — PROMETHAZINE HCL 25 MG/ML IJ SOLN: 6.2500 mg | INTRAMUSCULAR | Status: DC | PRN

## 2015-07-22 MED ORDER — METHOCARBAMOL 500 MG PO TABS
500.0000 mg | ORAL_TABLET | Freq: Four times a day (QID) | ORAL | Status: DC | PRN
  Administered 2015-07-23 – 2015-07-26 (×2): 500 mg via ORAL
  Filled 2015-07-22 (×2): qty 1

## 2015-07-22 MED ORDER — HYDROMORPHONE HCL 1 MG/ML IJ SOLN
0.5000 mg | INTRAMUSCULAR | Status: DC | PRN
  Administered 2015-07-22 (×2): 1 mg via INTRAVENOUS
  Filled 2015-07-22 (×2): qty 1

## 2015-07-22 MED ORDER — HYDROMORPHONE HCL 1 MG/ML IJ SOLN
INTRAMUSCULAR | Status: AC
  Filled 2015-07-22: qty 1

## 2015-07-22 MED ORDER — LACTATED RINGERS IV SOLN
INTRAVENOUS | Status: DC | PRN
  Administered 2015-07-22 (×2): via INTRAVENOUS

## 2015-07-22 MED ORDER — ROCURONIUM BROMIDE 100 MG/10ML IV SOLN
INTRAVENOUS | Status: AC
Start: 2015-07-22 — End: 2015-07-22
  Filled 2015-07-22: qty 1

## 2015-07-22 MED ORDER — CALCIUM CHLORIDE 10 % IV SOLN
INTRAVENOUS | Status: DC | PRN
  Administered 2015-07-22 (×4): 250 mg via INTRAVENOUS

## 2015-07-22 MED ORDER — PHENYLEPHRINE HCL 10 MG/ML IJ SOLN
INTRAMUSCULAR | Status: DC | PRN
  Administered 2015-07-22: 80 ug via INTRAVENOUS

## 2015-07-22 MED ORDER — LIDOCAINE HCL (CARDIAC) 20 MG/ML IV SOLN
INTRAVENOUS | Status: DC | PRN
  Administered 2015-07-22: 50 mg via INTRAVENOUS

## 2015-07-22 MED ORDER — PHENYLEPHRINE HCL 10 MG/ML IJ SOLN
10.0000 mg | INTRAMUSCULAR | Status: DC | PRN
  Administered 2015-07-22: 40 ug/min via INTRAVENOUS

## 2015-07-22 MED ORDER — PROPOFOL 10 MG/ML IV BOLUS
INTRAVENOUS | Status: DC | PRN
  Administered 2015-07-22: 100 mg via INTRAVENOUS

## 2015-07-22 MED ORDER — LACTATED RINGERS IV SOLN
INTRAVENOUS | Status: DC | PRN
  Administered 2015-07-22: 21:00:00 via INTRAVENOUS

## 2015-07-22 MED ORDER — ONDANSETRON HCL 4 MG/2ML IJ SOLN: 4.0000 mg | Freq: Four times a day (QID) | INTRAMUSCULAR | Status: DC | PRN

## 2015-07-22 MED ORDER — METHOCARBAMOL 1000 MG/10ML IJ SOLN
500.0000 mg | Freq: Four times a day (QID) | INTRAVENOUS | Status: DC | PRN
  Administered 2015-07-22: 500 mg via INTRAVENOUS
  Filled 2015-07-22 (×2): qty 5

## 2015-07-22 MED ORDER — SUCCINYLCHOLINE CHLORIDE 20 MG/ML IJ SOLN
INTRAMUSCULAR | Status: DC | PRN
  Administered 2015-07-22: 80 mg via INTRAVENOUS

## 2015-07-22 MED ORDER — ONDANSETRON HCL 4 MG/2ML IJ SOLN
INTRAMUSCULAR | Status: AC
  Filled 2015-07-22: qty 2

## 2015-07-22 MED ORDER — SODIUM CHLORIDE 0.9 % IV SOLN
INTRAVENOUS | Status: DC
  Administered 2015-07-22 – 2015-07-24 (×4): via INTRAVENOUS

## 2015-07-22 MED ORDER — HYDROMORPHONE HCL 1 MG/ML IJ SOLN
0.2500 mg | INTRAMUSCULAR | Status: DC | PRN
  Administered 2015-07-22: 0.5 mg via INTRAVENOUS

## 2015-07-22 MED ORDER — FENTANYL CITRATE (PF) 250 MCG/5ML IJ SOLN
INTRAMUSCULAR | Status: AC
  Filled 2015-07-22: qty 25

## 2015-07-22 MED ORDER — ONDANSETRON HCL 4 MG PO TABS: 4.0000 mg | ORAL_TABLET | Freq: Four times a day (QID) | ORAL | Status: DC | PRN

## 2015-07-22 MED ORDER — LIDOCAINE HCL (CARDIAC) 20 MG/ML IV SOLN
INTRAVENOUS | Status: AC
  Filled 2015-07-22: qty 5

## 2015-07-22 MED ORDER — DEXAMETHASONE SODIUM PHOSPHATE 10 MG/ML IJ SOLN
INTRAMUSCULAR | Status: DC | PRN
  Administered 2015-07-22: 10 mg via INTRAVENOUS

## 2015-07-22 MED ORDER — SODIUM CHLORIDE 0.9 % IV SOLN
INTRAVENOUS | Status: DC | PRN
  Administered 2015-07-22 (×2): via INTRAVENOUS

## 2015-07-22 MED ORDER — CALCIUM CHLORIDE 10 % IV SOLN
INTRAVENOUS | Status: AC
  Filled 2015-07-22: qty 10

## 2015-07-22 MED ORDER — DEXAMETHASONE SODIUM PHOSPHATE 10 MG/ML IJ SOLN
INTRAMUSCULAR | Status: AC
  Filled 2015-07-22: qty 1

## 2015-07-22 MED ORDER — FENTANYL CITRATE (PF) 100 MCG/2ML IJ SOLN
INTRAMUSCULAR | Status: DC | PRN
  Administered 2015-07-22 (×2): 25 ug via INTRAVENOUS
  Administered 2015-07-22 (×2): 50 ug via INTRAVENOUS

## 2015-07-22 SURGICAL SUPPLY — 59 items
BAG ZIPLOCK 12X15 (MISCELLANEOUS) ×3 IMPLANT
BANDAGE ELASTIC 6 VELCRO ST LF (GAUZE/BANDAGES/DRESSINGS) ×3 IMPLANT
BIT DRILL CALIBRATED 4.3MMX365 (DRILL) ×1 IMPLANT
BIT DRILL CROWE PNT TWST 4.5MM (DRILL) ×1 IMPLANT
BLADE SURG 15 STRL LF DISP TIS (BLADE) ×1 IMPLANT
BLADE SURG 15 STRL SS (BLADE) ×2
DRAPE ORTHO SPLIT 77X108 STRL (DRAPES) ×4
DRAPE STERI IOBAN 125X83 (DRAPES) ×3 IMPLANT
DRAPE SURG ORHT 6 SPLT 77X108 (DRAPES) ×2 IMPLANT
DRAPE TABLE BACK 44X90 PK DISP (DRAPES) ×3 IMPLANT
DRILL CALIBRATED 4.3MMX365 (DRILL) ×3
DRILL CROWE POINT TWIST 4.5MM (DRILL) ×3
DRSG AQUACEL AG ADV 3.5X10 (GAUZE/BANDAGES/DRESSINGS) ×3 IMPLANT
DRSG PAD ABDOMINAL 8X10 ST (GAUZE/BANDAGES/DRESSINGS) ×3 IMPLANT
DURAPREP 26ML APPLICATOR (WOUND CARE) ×3 IMPLANT
ELECT PENCIL ROCKER SW 15FT (MISCELLANEOUS) ×3 IMPLANT
ELECT REM PT RETURN 9FT ADLT (ELECTROSURGICAL) ×3
ELECTRODE REM PT RTRN 9FT ADLT (ELECTROSURGICAL) ×1 IMPLANT
GAUZE SPONGE 4X4 12PLY STRL (GAUZE/BANDAGES/DRESSINGS) ×3 IMPLANT
GAUZE XEROFORM 5X9 LF (GAUZE/BANDAGES/DRESSINGS) ×3 IMPLANT
GLOVE BIOGEL PI IND STRL 7.5 (GLOVE) ×1 IMPLANT
GLOVE BIOGEL PI IND STRL 8.5 (GLOVE) ×1 IMPLANT
GLOVE BIOGEL PI INDICATOR 7.5 (GLOVE) ×2
GLOVE BIOGEL PI INDICATOR 8.5 (GLOVE) ×2
GLOVE ECLIPSE 8.0 STRL XLNG CF (GLOVE) IMPLANT
GLOVE ORTHO TXT STRL SZ7.5 (GLOVE) ×6 IMPLANT
GLOVE SURG ORTHO 8.0 STRL STRW (GLOVE) ×3 IMPLANT
GOWN SPEC L3 XXLG W/TWL (GOWN DISPOSABLE) ×6 IMPLANT
GOWN STRL REUS W/TWL LRG LVL3 (GOWN DISPOSABLE) ×3 IMPLANT
GUIDEPIN 3.2X17.5 THRD DISP (PIN) ×3 IMPLANT
GUIDEWIRE BEAD TIP (WIRE) ×3 IMPLANT
KIT BASIN OR (CUSTOM PROCEDURE TRAY) ×3 IMPLANT
MANIFOLD NEPTUNE II (INSTRUMENTS) ×3 IMPLANT
NAIL FEM RETRO 10.5X300 (Nail) ×3 IMPLANT
NS IRRIG 1000ML POUR BTL (IV SOLUTION) ×3 IMPLANT
PACK GENERAL/GYN (CUSTOM PROCEDURE TRAY) ×3 IMPLANT
PADDING CAST COTTON 6X4 STRL (CAST SUPPLIES) ×3 IMPLANT
POSITIONER SURGICAL ARM (MISCELLANEOUS) ×3 IMPLANT
SCREW CORT TI DBL LEAD 5X32 (Screw) ×3 IMPLANT
SCREW CORT TI DBL LEAD 5X40 (Screw) ×3 IMPLANT
SCREW CORT TI DBL LEAD 5X56 (Screw) ×3 IMPLANT
SCREW CORT TI DBL LEAD 5X65 (Screw) ×3 IMPLANT
SCREW CORT TI DBL LEAD 5X80 (Screw) ×6 IMPLANT
SCREW CORT TI DBL LEAD 5X90 (Screw) ×3 IMPLANT
SCREW CORT TI DBLE LEAD 5X54 (Screw) ×3 IMPLANT
SET PAD KNEE POSITIONER (MISCELLANEOUS) ×3 IMPLANT
STAPLER VISISTAT (STAPLE) ×3 IMPLANT
STAPLER VISISTAT 35W (STAPLE) ×3 IMPLANT
SUT VIC AB 0 CT1 27 (SUTURE) ×2
SUT VIC AB 0 CT1 27XBRD ANTBC (SUTURE) ×1 IMPLANT
SUT VIC AB 1 CT1 27 (SUTURE) ×4
SUT VIC AB 1 CT1 27XBRD ANTBC (SUTURE) ×2 IMPLANT
SUT VIC AB 2-0 CT1 27 (SUTURE) ×4
SUT VIC AB 2-0 CT1 TAPERPNT 27 (SUTURE) ×2 IMPLANT
SUT VLOC 180 0 24IN GS25 (SUTURE) ×3 IMPLANT
TOWEL OR 17X26 10 PK STRL BLUE (TOWEL DISPOSABLE) ×6 IMPLANT
TRAY FOLEY W/METER SILVER 14FR (SET/KITS/TRAYS/PACK) ×3 IMPLANT
TRAY FOLEY W/METER SILVER 16FR (SET/KITS/TRAYS/PACK) ×3 IMPLANT
WATER STERILE IRR 1500ML POUR (IV SOLUTION) ×3 IMPLANT

## 2015-07-22 NOTE — Brief Op Note (Signed)
07/22/2015  7:56 PM  PATIENT:  Wynona CanesBettie Dimare  79 y.o. female  PRE-OPERATIVE DIAGNOSIS:  Closed comminuted right distal peri-prosthetic femur fracture  POST-OPERATIVE DIAGNOSIS:   Closed comminuted right distal peri-prosthetic femur fracture  PROCEDURE:  Procedure(s): INTRAMEDULLARY (IM) RETROGRADE FEMORAL NAILING (Right), ORIF right femur fracture  SURGEON:  Surgeon(s) and Role:    * Durene RomansMatthew Mikaelyn Arthurs, MD - Primary  PHYSICIAN ASSISTANT: Lanney GinsMatthew Babish, PA-C  ANESTHESIA:   general  EBL:   1000cc  BLOOD ADMINISTERED:  2 units PRBCs, due to starting Hgb of 8  DRAINS: none   LOCAL MEDICATIONS USED:  NONE  SPECIMEN:  No Specimen  DISPOSITION OF SPECIMEN:  N/A  COUNTS:  YES  TOURNIQUET:  * No tourniquets in log *  DICTATION: .Other Dictation: Dictation Number [id number]  PLAN OF CARE: Admit to inpatient   PATIENT DISPOSITION:  PACU - hemodynamically stable.   Delay start of Pharmacological VTE agent (>24hrs) due to surgical blood loss or risk of bleeding:  yes

## 2015-07-22 NOTE — Anesthesia Procedure Notes (Signed)
Procedure Name: Intubation Date/Time: 07/22/2015 8:49 PM Performed by: Edison PaceGRAY, Malyn Aytes E Pre-anesthesia Checklist: Patient identified, Timeout performed, Emergency Drugs available, Suction available and Patient being monitored Patient Re-evaluated:Patient Re-evaluated prior to inductionOxygen Delivery Method: Circle system utilized Preoxygenation: Pre-oxygenation with 100% oxygen Intubation Type: IV induction Ventilation: Mask ventilation without difficulty Laryngoscope Size: Mac and 4 Grade View: Grade I Tube type: Oral Tube size: 7.5 mm Number of attempts: 1 Airway Equipment and Method: Stylet Placement Confirmation: ETT inserted through vocal cords under direct vision,  positive ETCO2 and breath sounds checked- equal and bilateral Secured at: 21 cm Tube secured with: Tape Dental Injury: Teeth and Oropharynx as per pre-operative assessment

## 2015-07-22 NOTE — Transfer of Care (Signed)
Immediate Anesthesia Transfer of Care Note  Patient: Carla [name]  Procedure(s) Performed: Procedure(s): INTRAMEDULLARY (IM) RETROGRADE FEMORAL NAILING (Right)  Patient Location: PACU  Anesthesia Type:General  Level of Consciousness: awake, alert , patient cooperative and responds to stimulation  Airway & Oxygen Therapy: Patient Spontanous Breathing and Patient connected to face mask oxygen  Post-op Assessment: Report given to RN, Post -op Vital signs reviewed and stable and Patient moving all extremities  Post vital signs: Reviewed and stable  Last Vitals:  Filed Vitals:   07/22/15 1620  BP: 108/45  Pulse: 72  Temp: 37.1 C  Resp: 14    Complications: No apparent anesthesia complications

## 2015-07-22 NOTE — Interval H&P Note (Signed)
History and Physical Interval Note:  07/22/2015 7:55 PM  Carla [name]  has presented today for surgery, with the diagnosis of right distal femur fracture  The various methods of treatment have been discussed with the patient and family. After consideration of risks, benefits and other options for treatment, the patient has consented to  Procedure(s): INTRAMEDULLARY (IM) RETROGRADE FEMORAL NAILING (Right) as a surgical intervention .  The patient's history has been reviewed, patient examined, no change in status, stable for surgery.  I have reviewed the patient's chart and labs.  Questions were answered to the patient's satisfaction.     Shelda PalLIN,Quiara Killian D

## 2015-07-22 NOTE — H&P (Signed)
Carla [name] is an 79 y.o. female.    Chief Complaint:  Right distal femur periprosthetic fracture   HPI: Pt is a 79 y.o. female complaining of right knee pain.  She was walking without her walker, fell and was not able to bear weight on the right leg.  She was brought to the ER where she was diagnosis with a comminuted distal right femur periprosthetic fracture.  She was then transferred to [facility] and seen by Dr. Alvan Dame. He has discussed her hx, symptoms and x-rays.   Discussed with the patient and family that she will need surgery to repair the area.  They understand and would like to proceed.   PMH: Past Medical History  Diagnosis Date  . Hypertension   . Depression     PSH: Past Surgical History  Procedure Laterality Date  . Abdominal hysterectomy    . Throat surgery    . Dilation and curettage of uterus      "5 or 6"   . Cholecystectomy      Social History:  reports that she has never smoked. She has never used smokeless tobacco. She reports that she does not drink alcohol or use illicit drugs.  Allergies:  Allergies  Allergen Reactions  . Tape Rash    Adhesive tape  . Compazine [Prochlorperazine Edisylate] Other (See Comments)    confusion    Medications:   Current Facility-Administered Medications  Medication Dose Route Frequency Provider Last Rate Last Dose  . 0.9 %  sodium chloride infusion   Intravenous Continuous Danae Orleans, PA-C 75 mL/hr at 07/22/15 1357    . HYDROmorphone (DILAUDID) injection 0.5-1 mg  0.5-1 mg Intravenous Q2H PRN Danae Orleans, PA-C   1 mg at 07/22/15 1319  . ondansetron (ZOFRAN) tablet 4 mg  4 mg Oral Q6H PRN Danae Orleans, PA-C       Or  . ondansetron [facility] injection 4 mg  4 mg Intravenous Q6H PRN Danae Orleans, PA-C        Results for orders placed or performed during the hospital encounter of 07/22/15 (from the past 48 hour(s))  CBC     Status: Abnormal   Collection Time: 07/22/15  2:00 PM  Result Value Ref  Range   WBC 8.9 4.0 - 10.5 K/uL   RBC 2.56 (L) 3.87 - 5.11 MIL/uL   Hemoglobin 8.2 (L) 12.0 - 15.0 g/dL   HCT 25.8 (L) 36.0 - 46.0 %   MCV 100.8 (H) 78.0 - 100.0 fL   MCH 32.0 26.0 - 34.0 pg   MCHC 31.8 30.0 - 36.0 g/dL   RDW 15.5 11.5 - 15.5 %   Platelets 125 (L) 150 - 400 K/uL  Basic metabolic panel     Status: Abnormal   Collection Time: 07/22/15  2:00 PM  Result Value Ref Range   Sodium 139 135 - 145 mmol/L   Potassium 4.0 3.5 - 5.1 mmol/L   Chloride 105 101 - 111 mmol/L   CO2 26 22 - 32 mmol/L   Glucose, Bld 111 (H) 65 - 99 mg/dL   BUN 13 6 - 20 mg/dL   Creatinine, Ser 1.30 (H) 0.44 - 1.00 mg/dL   Calcium 7.8 (L) 8.9 - 10.3 mg/dL   GFR calc non Af Amer 39 (L) >60 mL/min   GFR calc Af Amer 45 (L) >60 mL/min    Comment: (NOTE) The eGFR has been calculated using the CKD EPI equation. This calculation has not been validated in all clinical  situations. eGFR's persistently <60 mL/min signify possible Chronic Kidney Disease.    Anion gap 8 5 - 15  Protime-INR     Status: None   Collection Time: 07/22/15  2:00 PM  Result Value Ref Range   Prothrombin Time 14.8 11.6 - 15.2 seconds   INR 1.15 0.00 - 1.49   Dg Outside Films Extremity  07/22/2015  CLINICAL DATA:  This exam is stored here for comparison purposes only and was performed at an outside facility.   Please contact the originating institution for any associated interpretation or report.   Dg Outside Films Extremity  07/22/2015  CLINICAL DATA:  This exam is stored here for comparison purposes only and was performed at an outside facility.   Please contact the originating institution for any associated interpretation or report.   Dg Outside Films Extremity  07/22/2015  CLINICAL DATA:  This exam is stored here for comparison purposes only and was performed at an outside facility.   Please contact the originating institution for any associated interpretation or report.     Review of Systems  Constitutional: Negative.    HENT: Negative.   Eyes: Negative.   Respiratory: Negative.   Cardiovascular: Negative.   Gastrointestinal: Negative.   Genitourinary: Negative.   Musculoskeletal: Positive for joint pain and falls.  Skin: Negative.   Neurological: Negative.   Endo/Heme/Allergies: Negative.   Psychiatric/Behavioral: Positive for depression.    Physical Exam  Constitutional: She appears well-developed.  HENT:  Head: Normocephalic.  Eyes: Pupils are equal, round, and reactive to light.  Neck: Neck supple. No JVD present. No tracheal deviation present. No thyromegaly present.  Cardiovascular: Normal rate, regular rhythm and intact distal pulses.   Respiratory: Effort normal and breath sounds normal.  GI: Soft. There is no tenderness. There is no guarding.  Musculoskeletal:       Right knee: She exhibits decreased range of motion, swelling, deformity and bony tenderness. She exhibits no laceration and no erythema. Tenderness found.  Neurological: She is alert.  Skin: Skin is warm and dry.  Psychiatric: She has a normal mood and affect.        Assessment/Plan Assessment:    Right distal femur periprosthetic fracture   Plan: Patient will undergo a ORIF of the distal femur periprosthetic fracture on 07/22/2015 per Dr. Alvan Dame at [facility]. Risks benefits and expectations were discussed with the patient. Patient understand risks, benefits and expectations and wishes to proceed.    West Pugh Carla Osmundson   PA-C  07/22/2015, 6:19 PM

## 2015-07-22 NOTE — Anesthesia Preprocedure Evaluation (Addendum)
Anesthesia Evaluation  Patient identified by MRN, date of birth, ID band Patient awake    Reviewed: Allergy & Precautions, NPO status , Patient's Chart, lab work & pertinent test results  Airway Mallampati: II  TM Distance: >3 FB Neck ROM: Full    Dental no notable dental hx.    Pulmonary neg pulmonary ROS,    Pulmonary exam normal breath sounds clear to auscultation       Cardiovascular hypertension, Pt. on medications Normal cardiovascular exam Rhythm:Regular Rate:Normal     Neuro/Psych negative neurological ROS  negative psych ROS   GI/Hepatic negative GI ROS, Neg liver ROS,   Endo/Other  negative endocrine ROS  Renal/GU negative Renal ROS  negative genitourinary   Musculoskeletal negative musculoskeletal ROS (+)   Abdominal   Peds negative pediatric ROS (+)  Hematology  (+) anemia ,   Anesthesia Other Findings   Reproductive/Obstetrics negative OB ROS                            Anesthesia Physical Anesthesia Plan  ASA: II  Anesthesia Plan: General   Post-op Pain Management:    Induction: Intravenous  Airway Management Planned: Oral ETT  Additional Equipment:   Intra-op Plan:   Post-operative Plan: Extubation in OR  Informed Consent: I have reviewed the patients History and Physical, chart, labs and discussed the procedure including the risks, benefits and alternatives for the proposed anesthesia with the patient or authorized representative who has indicated his/her understanding and acceptance.   Dental advisory given  Plan Discussed with: CRNA and Surgeon  Anesthesia Plan Comments:        Anesthesia Quick Evaluation

## 2015-07-23 LAB — BASIC METABOLIC PANEL
ANION GAP: 5 (ref 5–15)
BUN: 13 mg/dL (ref 6–20)
CHLORIDE: 110 mmol/L (ref 101–111)
CO2: 24 mmol/L (ref 22–32)
Calcium: 7.8 mg/dL — ABNORMAL LOW (ref 8.9–10.3)
Creatinine, Ser: 1.19 mg/dL — ABNORMAL HIGH (ref 0.44–1.00)
GFR calc Af Amer: 50 mL/min — ABNORMAL LOW (ref >60)
GFR, EST NON AFRICAN AMERICAN: 43 mL/min — AB (ref >60)
GLUCOSE: 161 mg/dL — AB (ref 65–99)
POTASSIUM: 4.4 mmol/L (ref 3.5–5.1)
Sodium: 139 mmol/L (ref 135–145)

## 2015-07-23 LAB — CBC
HEMATOCRIT: 24.7 % — AB (ref 36.0–46.0)
HEMOGLOBIN: 8.4 g/dL — AB (ref 12.0–15.0)
MCH: 32.2 pg (ref 26.0–34.0)
MCHC: 34 g/dL (ref 30.0–36.0)
MCV: 94.6 fL (ref 78.0–100.0)
PLATELETS: 130 10*3/uL — AB (ref 150–400)
RBC: 2.61 MIL/uL — AB (ref 3.87–5.11)
RDW: 16.7 % — ABNORMAL HIGH (ref 11.5–15.5)
WBC: 12.9 10*3/uL — AB (ref 4.0–10.5)

## 2015-07-23 MED ORDER — ACETAMINOPHEN 325 MG PO TABS: 650.0000 mg | ORAL_TABLET | Freq: Four times a day (QID) | ORAL | Status: DC | PRN

## 2015-07-23 MED ORDER — METOCLOPRAMIDE HCL 10 MG PO TABS: 5.0000 mg | ORAL_TABLET | Freq: Three times a day (TID) | ORAL | Status: DC | PRN

## 2015-07-23 MED ORDER — OXYCODONE HCL 5 MG PO TABS
5.0000 mg | ORAL_TABLET | ORAL | Status: DC | PRN
  Administered 2015-07-23: 15 mg via ORAL
  Administered 2015-07-23: 10 mg via ORAL
  Administered 2015-07-23: 15 mg via ORAL
  Administered 2015-07-23: 5 mg via ORAL
  Administered 2015-07-23: 15 mg via ORAL
  Administered 2015-07-24: 5 mg via ORAL
  Administered 2015-07-24: 10 mg via ORAL
  Administered 2015-07-24: 5 mg via ORAL
  Administered 2015-07-25 – 2015-07-26 (×5): 10 mg via ORAL
  Filled 2015-07-23: qty 1
  Filled 2015-07-23: qty 3
  Filled 2015-07-23: qty 1
  Filled 2015-07-23 (×2): qty 2
  Filled 2015-07-23: qty 1
  Filled 2015-07-23: qty 2
  Filled 2015-07-23: qty 3
  Filled 2015-07-23: qty 2
  Filled 2015-07-23: qty 3
  Filled 2015-07-23 (×3): qty 2

## 2015-07-23 MED ORDER — POLYETHYLENE GLYCOL 3350 17 G PO PACK
17.0000 g | PACK | Freq: Every day | ORAL | Status: DC | PRN
  Administered 2015-07-24 – 2015-07-25 (×2): 17 g via ORAL
  Filled 2015-07-23 (×2): qty 1

## 2015-07-23 MED ORDER — ACETAMINOPHEN 650 MG RE SUPP: 650.0000 mg | Freq: Four times a day (QID) | RECTAL | Status: DC | PRN

## 2015-07-23 MED ORDER — PHENOL 1.4 % MT LIQD
1.0000 | OROMUCOSAL | Status: DC | PRN
  Filled 2015-07-23: qty 177

## 2015-07-23 MED ORDER — SODIUM BICARBONATE 650 MG PO TABS
650.0000 mg | ORAL_TABLET | Freq: Two times a day (BID) | ORAL | Status: DC
  Administered 2015-07-23 – 2015-07-26 (×7): 650 mg via ORAL
  Filled 2015-07-23 (×9): qty 1

## 2015-07-23 MED ORDER — GABAPENTIN 300 MG PO CAPS
300.0000 mg | ORAL_CAPSULE | Freq: Every day | ORAL | Status: DC
  Administered 2015-07-23 – 2015-07-25 (×3): 300 mg via ORAL
  Filled 2015-07-23 (×5): qty 1

## 2015-07-23 MED ORDER — ALUM & MAG HYDROXIDE-SIMETH 200-200-20 MG/5ML PO SUSP: 30.0000 mL | ORAL | Status: DC | PRN

## 2015-07-23 MED ORDER — SERTRALINE HCL 50 MG PO TABS
50.0000 mg | ORAL_TABLET | Freq: Every day | ORAL | Status: DC
  Administered 2015-07-23 – 2015-07-26 (×4): 50 mg via ORAL
  Filled 2015-07-23 (×4): qty 1

## 2015-07-23 MED ORDER — CEFAZOLIN SODIUM-DEXTROSE 2-3 GM-% IV SOLR
2.0000 g | Freq: Four times a day (QID) | INTRAVENOUS | Status: AC
  Administered 2015-07-23 (×2): 2 g via INTRAVENOUS
  Filled 2015-07-23 (×2): qty 50

## 2015-07-23 MED ORDER — MAGNESIUM CITRATE PO SOLN: 1.0000 | Freq: Once | ORAL | Status: DC | PRN

## 2015-07-23 MED ORDER — LOSARTAN POTASSIUM 50 MG PO TABS
50.0000 mg | ORAL_TABLET | Freq: Every day | ORAL | Status: DC
  Administered 2015-07-23 – 2015-07-26 (×3): 50 mg via ORAL
  Filled 2015-07-23 (×4): qty 1

## 2015-07-23 MED ORDER — FUROSEMIDE 40 MG PO TABS
40.0000 mg | ORAL_TABLET | Freq: Every day | ORAL | Status: DC
  Administered 2015-07-23 – 2015-07-26 (×4): 40 mg via ORAL
  Filled 2015-07-23 (×4): qty 1

## 2015-07-23 MED ORDER — BISACODYL 10 MG RE SUPP
10.0000 mg | Freq: Every day | RECTAL | Status: DC | PRN
Start: 2015-07-23 — End: 2015-07-26
  Administered 2015-07-25: 10 mg via RECTAL
  Filled 2015-07-23: qty 1

## 2015-07-23 MED ORDER — GUAIFENESIN ER 600 MG PO TB12
600.0000 mg | ORAL_TABLET | Freq: Two times a day (BID) | ORAL | Status: DC
  Administered 2015-07-23 – 2015-07-26 (×7): 600 mg via ORAL
  Filled 2015-07-23 (×9): qty 1

## 2015-07-23 MED ORDER — LEVOTHYROXINE SODIUM 50 MCG PO TABS
50.0000 ug | ORAL_TABLET | Freq: Every day | ORAL | Status: DC
  Administered 2015-07-23 – 2015-07-26 (×4): 50 ug via ORAL
  Filled 2015-07-23 (×5): qty 1

## 2015-07-23 MED ORDER — SIMVASTATIN 20 MG PO TABS
20.0000 mg | ORAL_TABLET | Freq: Every day | ORAL | Status: DC
Start: 2015-07-23 — End: 2015-07-26
  Administered 2015-07-23 – 2015-07-25 (×3): 20 mg via ORAL
  Filled 2015-07-23 (×4): qty 1

## 2015-07-23 MED ORDER — FERROUS SULFATE 325 (65 FE) MG PO TABS
325.0000 mg | ORAL_TABLET | Freq: Three times a day (TID) | ORAL | Status: DC
  Administered 2015-07-23 – 2015-07-26 (×11): 325 mg via ORAL
  Filled 2015-07-23 (×13): qty 1

## 2015-07-23 MED ORDER — DIPHENHYDRAMINE HCL 25 MG PO CAPS: 25.0000 mg | ORAL_CAPSULE | Freq: Four times a day (QID) | ORAL | Status: DC | PRN

## 2015-07-23 MED ORDER — MENTHOL 3 MG MT LOZG: 1.0000 | LOZENGE | OROMUCOSAL | Status: DC | PRN

## 2015-07-23 MED ORDER — ALLOPURINOL 100 MG PO TABS
100.0000 mg | ORAL_TABLET | Freq: Every day | ORAL | Status: DC
  Administered 2015-07-23 – 2015-07-26 (×4): 100 mg via ORAL
  Filled 2015-07-23 (×4): qty 1

## 2015-07-23 MED ORDER — MECLIZINE HCL 25 MG PO TABS
25.0000 mg | ORAL_TABLET | Freq: Two times a day (BID) | ORAL | Status: DC | PRN
  Filled 2015-07-23: qty 1

## 2015-07-23 MED ORDER — ENOXAPARIN SODIUM 40 MG/0.4ML ~~LOC~~ SOLN
40.0000 mg | Freq: Every day | SUBCUTANEOUS | Status: DC
  Administered 2015-07-23 – 2015-07-26 (×4): 40 mg via SUBCUTANEOUS
  Filled 2015-07-23 (×4): qty 0.4

## 2015-07-23 MED ORDER — METOCLOPRAMIDE HCL 5 MG/ML IJ SOLN: 5.0000 mg | Freq: Three times a day (TID) | INTRAMUSCULAR | Status: DC | PRN

## 2015-07-23 MED ORDER — LORATADINE 10 MG PO TABS
10.0000 mg | ORAL_TABLET | Freq: Every day | ORAL | Status: DC
  Administered 2015-07-23 – 2015-07-26 (×4): 10 mg via ORAL
  Filled 2015-07-23 (×4): qty 1

## 2015-07-23 MED ORDER — DOCUSATE SODIUM 100 MG PO CAPS
100.0000 mg | ORAL_CAPSULE | Freq: Two times a day (BID) | ORAL | Status: DC
  Administered 2015-07-23 – 2015-07-26 (×7): 100 mg via ORAL

## 2015-07-23 MED ORDER — HYDROMORPHONE HCL 1 MG/ML IJ SOLN
0.5000 mg | INTRAMUSCULAR | Status: DC | PRN
  Administered 2015-07-23: 1 mg via INTRAVENOUS
  Administered 2015-07-23: 0.5 mg via INTRAVENOUS
  Filled 2015-07-23 (×2): qty 1

## 2015-07-23 MED ORDER — POTASSIUM CHLORIDE CRYS ER 20 MEQ PO TBCR
20.0000 meq | EXTENDED_RELEASE_TABLET | Freq: Every day | ORAL | Status: DC
Start: 2015-07-23 — End: 2015-07-26
  Administered 2015-07-23 – 2015-07-26 (×4): 20 meq via ORAL
  Filled 2015-07-23 (×4): qty 1

## 2015-07-23 NOTE — Progress Notes (Signed)
     Subjective: 1 Day Post-Op Procedure(s) (LRB): INTRAMEDULLARY (IM) RETROGRADE FEMORAL NAILING (Right)   Patient reports pain as moderate, pain controlled. No events throughout the night. States that she really hasn't moved the leg much. Says she's a little upset with herself since she feels she has done this to her self, by not using her walker.  Tried to stress she can only move forward and proceed as instructed to get allow this leg the best chance of healing.  Objective:   VITALS:   Filed Vitals:   07/23/15 0528  BP: 111/47  Pulse: 75  Temp: 98.2 F (36.8 C)  Resp: 16    Dorsiflexion/Plantar flexion intact Incision: dressing C/D/I No cellulitis present Compartment soft  LABS  Recent Labs  07/22/15 1400 07/23/15 0437  HGB 8.2* 8.4*  HCT 25.8* 24.7*  WBC 8.9 12.9*  PLT 125* 130*     Recent Labs  07/22/15 1400 07/23/15 0437  NA 139 139  K 4.0 4.4  BUN 13 13  CREATININE 1.30* 1.19*  GLUCOSE 111* 161*     Assessment/Plan: 1 Day Post-Op Procedure(s) (LRB): INTRAMEDULLARY (IM) RETROGRADE FEMORAL NAILING (Right) Advance diet Up with therapy D/C IV fluids Discharge to be arranged when ready     Anastasio AuerbachMatthew S. Ritaj Dullea   PAC  07/23/2015, 8:28 AM

## 2015-07-23 NOTE — Progress Notes (Signed)
CSW continuing to follow.   CSW followed up with pt and pt daughter regarding SNF bed offers.  CSW notified pt and pt daughter that [facility] was able to offer pt a bed, but could not accept pt over the weekend if pt medically cleared over the weekend. Pt daughter stated that MD anticipated discharge would not be until Monday or Tuesday.  CSW discussed with pt and pt daughter that [facility] and facility can accept weekend admissions if pt discharged over the weekend.  Pt daughter expressed that preference would be [facility], but understands that if pt discharge over the weekend then [facility] would [facility].   CSW confirmed with [facility] that they can accept weekend admissions if needed.  CSW to continue to follow to provide support and assist with pt disposition needs.  Carla [name], Carla [name], Carla [name] Clinical Social Work [phone]

## 2015-07-23 NOTE — Clinical Social Work Placement (Signed)
   CLINICAL SOCIAL WORK PLACEMENT  NOTE  Date:  07/23/2015  Patient Details  Name: Carla [name] MRN: [id number] Date of Birth: 04/18/1936  Clinical Social Work is seeking post-discharge placement for this patient at the Skilled  Nursing Facility level of care (*CSW will initial, date and re-position this form in  chart as items are completed):  Yes   Patient/family provided with [facility] Clinical Social Work Department's list of facilities offering this level of care within the geographic area requested by the patient (or if unable, by the patient's family).  Yes   Patient/family informed of their freedom to choose among providers that offer the needed level of care, that participate in Medicare, Medicaid or managed care program needed by the patient, have an available bed and are willing to accept the patient.  Yes   Patient/family informed of [facility]'s ownership interest in [facility] and [facility], as well as of the fact that they are under no obligation to receive care at these facilities.  PASRR submitted to EDS on       PASRR number received on       Existing PASRR number confirmed on 07/23/15     FL2 transmitted to all facilities in geographic area requested by pt/family on 07/23/15     FL2 transmitted to all facilities within larger geographic area on       Patient informed that his/her managed care company has contracts with or will negotiate with certain facilities, including the following:            Patient/family informed of bed offers received.  Patient chooses bed at       Physician recommends and patient chooses bed at      Patient to be transferred to   on  .  Patient to be transferred to facility by       Patient family notified on   of transfer.  Name of family member notified:        PHYSICIAN Please sign FL2     Additional Comment:    _______________________________________________ [name], [name], LCSW 07/23/2015, 12:39  PM

## 2015-07-23 NOTE — Clinical Social Work Note (Signed)
Clinical Social Work Assessment  Patient Details  Name: Carla [name] MRN: [id number] Date of Birth: 03/16/1936  Date of referral:  07/23/15               Reason for consult:  Discharge Planning                Permission sought to share information with:  Family Supports Permission granted to share information::  Yes, Verbal Permission Granted  Name::     Rickard RhymesMichelle Kingery  Agency::     Relationship::  daughter  Contact Information:  [phone]  Housing/Transportation Living arrangements for the past 2 months:  Apartment Source of Information:  Patient, Adult Children Patient Interpreter Needed:  None Criminal Activity/Legal Involvement Pertinent to Current Situation/Hospitalization:    Significant Relationships:  Adult Children Lives with:  Self Do you feel safe going back to the place where you live?  No Need for family participation in patient care:  Yes (Comment)  Care giving concerns:  Pt admitted with hip fracture . Pt from home alone. Pt will need SNF.    Social Worker assessment / plan:  CSW received referral for new SNF.  Pt admitted from home where pt lives alone. CSW [facility]. Pt discussed that she was aware that she will need SNF. Pt states that [name] in [facility], but cannot remember the name. Pt provided permission for CSW to contact pt daughter.   CSW contacted pt daughter, Marcelino DusterMichelle via telephone. Pt daughter discussed that she was aware of need for rehab. Pt daughter stated that [facility] would [facility], [facility], and third choice, [facility].   CSW completed FL2 and initiated search.   CSW to follow up with pt and pt daughter re: SNF bed offers.  CSW to continue to follow.   Employment status:  Retired Health and safety inspectornsurance information:  Medicare PT Recommendations:  Skilled Nursing Facility Information / Referral to community resources:  Skilled Nursing  Facility  Patient/Family's Response to care:  Pt alert and oriented x 4. Pt recognizes [facility]. Pt agreeable to pt daughter participation in plan.  Patient/Family's Understanding of and Emotional Response to Diagnosis, Current Treatment, and Prognosis:  Pt coping appropriately following surgery and understands need for rehab.  Emotional Assessment Appearance:  Appears stated age Attitude/Demeanor/Rapport:  Other (calm and cooperative) Affect (typically observed):  Appropriate Orientation:  Oriented to Self, Oriented to Place, Oriented to  Time, Oriented to Situation Alcohol / Substance use:  Not Applicable Psych involvement (Current and /or in the community):  No (Comment)  Discharge Needs  Concerns to be addressed:  Discharge Planning Concerns Readmission within the last 30 days:  No Current discharge risk:  Physical Impairment Barriers to Discharge:  Continued Medical Work up   Loletta SpecterKIDD, Carla Biederman A, LCSW 07/23/2015, 12:31 PM  [phone]

## 2015-07-23 NOTE — Evaluation (Signed)
Occupational Therapy Evaluation Patient Details Name: Carla [name] MRN: [id number] DOB: 04/10/1936 Today's Date: 07/23/2015    History of Present Illness Pt is s/p fall with right distal femur periprosthetic fracture. Pt underwent IM femoral nailing on 07/22/15.   Clinical Impression   Pt limited by significant pain with attempt to sit EOB so returned to supine. Reviewed precautions with pt regarding NWB R LE and no ROM R knee. Pt will benefit from skilled OT to progress ADL independence for next venue. Will follow.    Follow Up Recommendations  SNF;Supervision/Assistance - 24 hour    Equipment Recommendations  Other (comment) (TBA next venue)    Recommendations for Other Services       Precautions / Restrictions Precautions Precautions: Fall Precaution Comments: No ROM R knee Required Braces or Orthoses: Knee Immobilizer - Right Knee Immobilizer - Right: On at all times Restrictions Weight Bearing Restrictions: Yes RLE Weight Bearing: Non weight bearing      Mobility Bed Mobility Overal bed mobility: +2 for physical assistance;+ 2 for safety/equipment;Needs Assistance Bed Mobility: Supine to Sit;Sit to Supine     Supine to sit: +2 for physical assistance;Max assist Sit to supine: Max assist;+2 for physical assistance   General bed mobility comments: assist for trunk to upright and for R LE over to EOB. Pt unable to tolerate R LE being lowered toward the floor so returned to supine.   Transfers                 General transfer comment: unable to attempt this visit due to signficant pain with attempting to sit up.    Balance                                            ADL Overall ADL's : Needs assistance/impaired Eating/Feeding: Independent;Bed level   Grooming: Wash/dry face;Set up;Bed level   Upper Body Bathing: Bed level;Minimal assitance   Lower Body Bathing: Total assistance;+2 for physical assistance;Bed level   Upper Body  Dressing : Moderate assistance;Bed level   Lower Body Dressing: +2 for physical assistance;Total assistance;Bed level                 General ADL Comments: Attempted to sit on EOB but pt crying out in pain once R LE over the edge of bed though supported by therapist. Unable to proceed to fully sitting EOB due to significant pain so returned to supine. Pt very apologetic for not being able to tolerate more but reassured pt not to worry about this and that she was limited by pain despite being premedicated. total assist to don socks supine and  +2 assist for partial roll to straighten pad under her once returned to supine.      Vision     Perception     Praxis      Pertinent Vitals/Pain Pain Assessment: Faces Faces Pain Scale: Hurts worst Pain Location: R LE Pain Descriptors / Indicators: Guarding;Crying Pain Intervention(s): Repositioned;Monitored during session     Hand Dominance Left   Extremity/Trunk Assessment Upper Extremity Assessment Upper Extremity Assessment: Overall WFL for tasks assessed           Communication Communication Communication: No difficulties   Cognition Arousal/Alertness: Awake/alert Behavior During Therapy: WFL for tasks assessed/performed Overall Cognitive Status: Within Functional Limits for tasks assessed  General Comments       Exercises       Shoulder Instructions      Home Living Family/patient expects to be discharged to:: Skilled nursing facility                                        Prior Functioning/Environment Level of Independence: Independent with assistive device(s)             OT Diagnosis: Generalized weakness;Acute pain   OT Problem List: Decreased strength;Decreased knowledge of use of DME or AE;Pain;Decreased knowledge of precautions   OT Treatment/Interventions: Self-care/ADL training;Patient/family education;Therapeutic activities;DME and/or AE instruction     OT Goals(Current goals can be found in the care plan section) Acute Rehab OT Goals Patient Stated Goal: decrease pain and move better. OT Goal Formulation: With patient Time For Goal Achievement: 07/30/15 Potential to Achieve Goals: Good  OT Frequency: Min 2X/week   Barriers to D/C:            Co-evaluation PT/OT/SLP Co-Evaluation/Treatment: Yes Reason for Co-Treatment: For patient/therapist safety   OT goals addressed during session: ADL's and self-care;Proper use of Adaptive equipment and DME      End of Session Equipment Utilized During Treatment: Right knee immobilizer  Activity Tolerance: Patient limited by pain Patient left: in bed;with call bell/phone within reach   Time: 6063-0160 OT Time Calculation (min): 26 min Charges:  OT General Charges $OT Visit: 1 Procedure OT Evaluation $Initial OT Evaluation Tier I: 1 Procedure G-Codes:    Lennox Laity  [phone] 07/23/2015, 12:28 PM

## 2015-07-23 NOTE — Op Note (Signed)
NAMELAGENA, STRAND                ACCOUNT NO.:  [id number]  MEDICAL RECORD NO.:  [id number]  LOCATION:  1601                         FACILITY:  [facility]  PHYSICIAN:  Madlyn Frankel. Charlann Boxer, M.D.  DATE OF BIRTH:  26-Jan-1936  DATE OF PROCEDURE:  07/22/2015 DATE OF DISCHARGE:                              OPERATIVE REPORT   PREOPERATIVE DIAGNOSIS:  Comminuted displaced distal periprosthetic right femur fracture, closed.  POSTOPERATIVE DIAGNOSES: 1. Comminuted displaced distal periprosthetic right femur fracture,     closed. 2. Osteoporosis. 3. Obesity. 4. Lower extremity edema.  PROCEDURE:  Open reduction and internal fixation of right distal femur fracture utilizing a [employer] [place] retrograde femoral nail measuring 10.5 x 380 mm with 4 screws placed distally, then locked into place using the [facility] nail technology and proximal interlock from an anterior-posterior direction.  SURGICAL:  Madlyn Frankel. Charlann Boxer, M.D.  ASSISTANT:  Lanney Gins, PA-C.  Note that Mr. Carmon Sails was present for the entirety of the case from preoperative positioning and perioperative management of operative extremity during the procedure, facilitation of case, primary wound closure.  ANESTHESIA:  General.  SPECIMENS:  None.  COMPLICATIONS:  None apparent.  BLOOD LOSS:  Probably 1 L.  IV fluids administered including blood products of 2 units packed red blood cells due to starting hemoglobin of 8 and the blood loss during the surgery.  INDICATION FOR PROCEDURE:  Ms. Mccleese is a 79 year old patient of mine with history of bilateral total knee arthroplasties.  I have not seen her for some time and she was in her usual state of health until she had a fall today.  She landed directly on her right side.  She had immediate onset of pain, inability to bear weight, and deformity.  She was brought to [facility] per her request after being initially evaluated in [place].  Radiographs revealed significant  osteoporosis associated with a comminuted, complex, displaced right distal periprosthetic fracture.  Necessity of the procedure reviewed.  Risks of nonunion, malunion, and need for future surgery were all discussed.  Reviewed in additional setting of infection and DVT.  She wished to proceed.  Consent was obtained.  PROCEDURE IN DETAIL:  The patient was brought to operative theater. Once adequate anesthesia, preoperative antibiotics, Ancef 2 g administered, she was positioned supine on the operative table to allow for fluoroscopy to be utilized on the lower extremity.  Based on her hemoglobin of 8, we have typed and crossed her for 2 units of blood to give during the procedures due to expected blood loss with the procedure without the tourniquet.  The right lower extremity was then pre-draped with a 10-15 drape draping out the perineum.  The right lower extremity was then prepped and draped in sterile fashion.  A time-out was performed identifying the patient, planned procedure, and extremity.  At this point, I had demarcated her old incision, excised her old scar to allow for proximal exposure of the knee for fracture manipulation through the knee incision.  Hemostasis was obtained as best we could as she was having punctate bleeding through her adipose tissue.  Once the soft tissue planes were created, I made a median arthrotomy and extended this  up, so I could visualize the fracture site.  At this point with fluoroscopy utilized and a small triangle positioner, we were able to reduce the fracture into near anatomic position, given the parameters of her comminution and diffuse osteoporotic changes of bone.  I used a bone tenaculum to help support the medial and lateral condyles as they flared up from the prosthesis.  Once I had it aligned in AP and lateral planes, I opened up the box component to the [employer] rotating platform knee system and passed a guidewire, confirmed  its orientation in AP and lateral planes.  I then measured and selected 380 mm nail.  The 10.5 x 380 mm nail was then opened and then applied to the insertion jig.  I did not ream the femur as it was not necessary based on her osteoporosis.  The nail was then passed by hand to its appropriate depth in the box of the femur.  With this in place and the fracture at this point manually reduced, I placed 4 screws through the jig; 2 lateral distal, 1 distal oblique, and 1 proximal oblique.  Once all 4 screws were in place, we tightened and locked the screws down to the nail through this technology.  Final radiographs were obtained in AP and lateral planes, I then attended to the proximal interlock.  Under perfect circle technique, a single distal interlock screw was placed.  Once this was done, the wounds were irrigated with normal saline solution.  The knee wound was closed in layers using #1 Vicryl and 0 V- Loc suture on the extensor mechanism.  The remaining wound was closed with 2-0 Vicryl and staples.  The lateral based incision for the distal interlocks was closed with 2-0 Vicryl and staples, and the proximal interlock was closed with 2-0 Vicryl and staples.  Please note, she had a significant rent in the distal lateral quadriceps from her fracture site.  This was reapproximated as well using #1 Vicryl.  Upon closure of her wounds, her knee was cleaned, dried, and dressed sterilely using Xeroform and a bulky sterile wrap.  She was placed in a knee immobilizer with ice and brought to recovery room.  We will continue to monitor her hemoglobin in the postoperative period with 2 units of blood on hold.  She will be nonweightbearing for probably 6-8 weeks until this fracture heals.  I will keep her immobilized for least 4 weeks to prevent excessive stress across this comminuted fracture site.  This is a very tenuous fracture pattern particularly associated with her osteoporosis.  Findings  were reviewed with family.     Madlyn FrankelMatthew D. Charlann Boxerlin, M.D.     MDO/MEDQ  D:  07/22/2015  T:  07/23/2015  Job:  [id number]

## 2015-07-23 NOTE — Anesthesia Postprocedure Evaluation (Signed)
  Anesthesia Post-op Note  Patient: Engineer, civil (consulting)Carla [name]  Procedure(s) Performed: Procedure(s) (LRB): INTRAMEDULLARY (IM) RETROGRADE FEMORAL NAILING (Right)  Patient Location: PACU  Anesthesia Type: General  Level of Consciousness: awake and alert   Airway and Oxygen Therapy: Patient Spontanous Breathing  Post-op Pain: mild  Post-op Assessment: Post-op Vital signs reviewed, Patient's Cardiovascular Status Stable, Respiratory Function Stable, Patent Airway and No signs of Nausea or vomiting  Last Vitals:  Filed Vitals:   07/22/15 2353  BP:   Pulse: 84  Temp:   Resp: 17    Post-op Vital Signs: stable   Complications: No apparent anesthesia complications

## 2015-07-23 NOTE — Evaluation (Signed)
Physical Therapy Evaluation Patient Details Name: Carla CanesBettie Bains MRN: [id number] DOB: 10/11/1935 Today's Date: 07/23/2015   History of Present Illness  Pt is s/p fall with right distal femur periprosthetic fracture. Pt underwent IM femoral nailing on 07/22/15.  Clinical Impression  Pt admitted as above and presenting with decreased R LE strength/ROM, post op pain, and NWB on L LE limiting functional mobility.  Pt would benefit from follow up rehab at SNF level to maximize IND and safety.    Follow Up Recommendations SNF    Equipment Recommendations  None recommended by PT    Recommendations for Other Services OT consult     Precautions / Restrictions Precautions Precautions: Fall Precaution Comments: No ROM R knee Required Braces or Orthoses: Knee Immobilizer - Right Knee Immobilizer - Right: On at all times Restrictions Weight Bearing Restrictions: Yes RLE Weight Bearing: Non weight bearing      Mobility  Bed Mobility Overal bed mobility: +2 for physical assistance;+ 2 for safety/equipment;Needs Assistance Bed Mobility: Supine to Sit;Sit to Supine     Supine to sit: +2 for physical assistance;Max assist Sit to supine: Max assist;+2 for physical assistance   General bed mobility comments: assist for trunk to upright and for R LE over to EOB. Pt unable to tolerate R LE being lowered toward the floor so returned to supine.   Transfers                 General transfer comment: unable to attempt this visit due to signficant pain with attempting to sit up.  Ambulation/Gait                Stairs            Wheelchair Mobility    Modified Rankin (Stroke Patients Only)       Balance                                             Pertinent Vitals/Pain Pain Assessment: Faces Faces Pain Scale: Hurts worst Pain Location: R LE Pain Descriptors / Indicators: Crying;Guarding Pain Intervention(s): Limited activity within patient's  tolerance;Monitored during session;Premedicated before session;Ice applied;Repositioned    Home Living Family/patient expects to be discharged to:: Skilled nursing facility                      Prior Function Level of Independence: Independent with assistive device(s)               Hand Dominance   Dominant Hand: Left    Extremity/Trunk Assessment   Upper Extremity Assessment: Overall WFL for tasks assessed           Lower Extremity Assessment: RLE deficits/detail RLE Deficits / Details: AROM at ankle WFL; KI in place       Communication   Communication: No difficulties  Cognition Arousal/Alertness: Awake/alert Behavior During Therapy: WFL for tasks assessed/performed Overall Cognitive Status: Within Functional Limits for tasks assessed                      General Comments General comments (skin integrity, edema, etc.): unable to assess balance as pt not able to tolerate R LE onto the floor to sit on EOB.    Exercises General Exercises - Lower Extremity Ankle Circles/Pumps: AROM;Both;15 reps;Supine      Assessment/Plan    PT Assessment  PT Diagnosis Difficulty walking   PT Problem List    PT Treatment Interventions     PT Goals (Current goals can be found in the Care Plan section) Acute Rehab PT Goals Patient Stated Goal: decrease pain and move better. PT Goal Formulation: With patient Time For Goal Achievement: 08/06/15 Potential to Achieve Goals: Fair    Frequency     Barriers to discharge        Co-evaluation PT/OT/SLP Co-Evaluation/Treatment: Yes Reason for Co-Treatment: For patient/therapist safety PT goals addressed during session: Mobility/safety with mobility OT goals addressed during session: ADL's and self-care       End of Session Equipment Utilized During Treatment: Gait belt;Right knee immobilizer Activity Tolerance: Patient limited by pain Patient left: in bed;with call bell/phone within reach Nurse  Communication: Mobility status         Time: 1002-1027 PT Time Calculation (min) (ACUTE ONLY): 25 min   Charges:   PT Evaluation $Initial PT Evaluation Tier I: 1 Procedure     PT G Codes:        Antwion Carpenter 08-14-2015, 12:42 PM

## 2015-07-24 LAB — CBC
HEMATOCRIT: 20.7 % — AB (ref 36.0–46.0)
HEMOGLOBIN: 6.9 g/dL — AB (ref 12.0–15.0)
MCH: 31.9 pg (ref 26.0–34.0)
MCHC: 33.3 g/dL (ref 30.0–36.0)
MCV: 95.8 fL (ref 78.0–100.0)
Platelets: 108 10*3/uL — ABNORMAL LOW (ref 150–400)
RBC: 2.16 MIL/uL — ABNORMAL LOW (ref 3.87–5.11)
RDW: 17 % — ABNORMAL HIGH (ref 11.5–15.5)
WBC: 10.7 10*3/uL — AB (ref 4.0–10.5)

## 2015-07-24 LAB — BASIC METABOLIC PANEL
ANION GAP: 4 — AB (ref 5–15)
BUN: 17 mg/dL (ref 6–20)
CHLORIDE: 108 mmol/L (ref 101–111)
CO2: 25 mmol/L (ref 22–32)
Calcium: 7.6 mg/dL — ABNORMAL LOW (ref 8.9–10.3)
Creatinine, Ser: 1.69 mg/dL — ABNORMAL HIGH (ref 0.44–1.00)
GFR calc non Af Amer: 28 mL/min — ABNORMAL LOW (ref >60)
GFR, EST AFRICAN AMERICAN: 33 mL/min — AB (ref >60)
GLUCOSE: 93 mg/dL (ref 65–99)
POTASSIUM: 4.6 mmol/L (ref 3.5–5.1)
Sodium: 137 mmol/L (ref 135–145)

## 2015-07-24 MED ORDER — SODIUM CHLORIDE 0.9 % IV SOLN
Freq: Once | INTRAVENOUS | Status: AC
  Administered 2015-07-24: 09:00:00 via INTRAVENOUS

## 2015-07-24 MED ORDER — SODIUM CHLORIDE 0.9 % IV SOLN: Freq: Once | INTRAVENOUS | Status: DC

## 2015-07-24 NOTE — Progress Notes (Signed)
   Subjective: 2 Days Post-Op Procedure(s) (LRB): INTRAMEDULLARY (IM) RETROGRADE FEMORAL NAILING (Right)  Pt c/o moderate pain to right hip and leg this morning Helped with change in position Denies any other symptoms or issues Patient reports pain as moderate.  Objective:   VITALS:   Filed Vitals:   07/24/15 0518  BP: 104/66  Pulse: 82  Temp: 98.4 F (36.9 C)  Resp: 16    Right lower extremity in immobilizer nv intact distally Mild to moderate pedal edema distally bilaterally  LABS  Recent Labs  07/22/15 1400 07/23/15 0437 07/24/15 0410  HGB 8.2* 8.4* 6.9*  HCT 25.8* 24.7* 20.7*  WBC 8.9 12.9* 10.7*  PLT 125* 130* 108*     Recent Labs  07/22/15 1400 07/23/15 0437 07/24/15 0410  NA 139 139 137  K 4.0 4.4 4.6  BUN 13 13 17   CREATININE 1.30* 1.19* 1.69*  GLUCOSE 111* 161* 93     Assessment/Plan: 2 Days Post-Op Procedure(s) (LRB): INTRAMEDULLARY (IM) RETROGRADE FEMORAL NAILING (Right) Acute blood loss anemia - will transfuse today Plan for SNF on Monday PT/OT as able Pain management   Alphonsa OverallBrad Meyli Boice, MPAS, PA-C  07/24/2015, 8:08 AM

## 2015-07-24 NOTE — Progress Notes (Signed)
B Dixon returned call placed by [facility].   Sharrell Ku Halaina Vanduzer RN

## 2015-07-24 NOTE — Progress Notes (Signed)
PT Cancellation Note  Patient Details Name: Carla [name] MRN: [id number] DOB: 08/19/1936   Cancelled Treatment:     PT deferred this date.  Pt with Hgb 6.9 with transfusion order written.  Will follow.   Luis Sami 07/24/2015, 7:21 AM

## 2015-07-25 LAB — URINALYSIS, ROUTINE W REFLEX MICROSCOPIC
Bilirubin Urine: NEGATIVE
Glucose, UA: NEGATIVE mg/dL
HGB URINE DIPSTICK: NEGATIVE
KETONES UR: NEGATIVE mg/dL
LEUKOCYTES UA: NEGATIVE
Nitrite: NEGATIVE
PROTEIN: NEGATIVE mg/dL
Specific Gravity, Urine: 1.007 (ref 1.005–1.030)
UROBILINOGEN UA: 0.2 mg/dL (ref 0.0–1.0)
pH: 6 (ref 5.0–8.0)

## 2015-07-25 LAB — TYPE AND SCREEN
ABO/RH(D): A NEG
Antibody Screen: NEGATIVE
UNIT DIVISION: 0
Unit division: 0
Unit division: 0
Unit division: 0

## 2015-07-25 LAB — BASIC METABOLIC PANEL
Anion gap: 5 (ref 5–15)
BUN: 18 mg/dL (ref 6–20)
CHLORIDE: 107 mmol/L (ref 101–111)
CO2: 25 mmol/L (ref 22–32)
Calcium: 7.6 mg/dL — ABNORMAL LOW (ref 8.9–10.3)
Creatinine, Ser: 1.43 mg/dL — ABNORMAL HIGH (ref 0.44–1.00)
GFR, EST AFRICAN AMERICAN: 40 mL/min — AB (ref >60)
GFR, EST NON AFRICAN AMERICAN: 35 mL/min — AB (ref >60)
Glucose, Bld: 81 mg/dL (ref 65–99)
POTASSIUM: 4.6 mmol/L (ref 3.5–5.1)
SODIUM: 137 mmol/L (ref 135–145)

## 2015-07-25 LAB — CBC
HCT: 26.3 % — ABNORMAL LOW (ref 36.0–46.0)
HEMOGLOBIN: 8.8 g/dL — AB (ref 12.0–15.0)
MCH: 31.5 pg (ref 26.0–34.0)
MCHC: 33.5 g/dL (ref 30.0–36.0)
MCV: 94.3 fL (ref 78.0–100.0)
PLATELETS: 121 10*3/uL — AB (ref 150–400)
RBC: 2.79 MIL/uL — AB (ref 3.87–5.11)
RDW: 16.2 % — ABNORMAL HIGH (ref 11.5–15.5)
WBC: 8.7 10*3/uL (ref 4.0–10.5)

## 2015-07-25 NOTE — Progress Notes (Signed)
Pt c/o pain in right great toe thinks its gout, wrapped with gauze for comfort per pt request. Pt would like allopurinol as was home med for gout prn thank you [name], [name]

## 2015-07-25 NOTE — Progress Notes (Signed)
   Subjective: 3 Days Post-Op Procedure(s) (LRB): INTRAMEDULLARY (IM) RETROGRADE FEMORAL NAILING (Right)  Pt feeling a little better today  Still c/o moderate pain to right hip  Also c/o mild pain in right great toe, has hx of gout Patient reports pain as moderate.  Objective:   VITALS:   Filed Vitals:   07/25/15 0509  BP: 118/46  Pulse: 94  Temp: 99.2 F (37.3 C)  Resp: 16    Right lower extremity in knee immobilizer nv intact distally Normal exam of right great toe  LABS  Recent Labs  07/23/15 0437 07/24/15 0410 07/25/15 0508  HGB 8.4* 6.9* 8.8*  HCT 24.7* 20.7* 26.3*  WBC 12.9* 10.7* 8.7  PLT 130* 108* 121*     Recent Labs  07/23/15 0437 07/24/15 0410 07/25/15 0508  NA 139 137 137  K 4.4 4.6 4.6  BUN 13 17 18   CREATININE 1.19* 1.69* 1.43*  GLUCOSE 161* 93 81     Assessment/Plan: 3 Days Post-Op Procedure(s) (LRB): INTRAMEDULLARY (IM) RETROGRADE FEMORAL NAILING (Right) Blood loss anemia - improved Will start allopurinol per pt's request for right great toe PT/OT D/c planning hopefully tomorrow Pain management  Alphonsa OverallBrad Adele Milson, MPAS, PA-C  07/25/2015, 7:08 AM

## 2015-07-25 NOTE — Progress Notes (Signed)
Physical Therapy Treatment Patient Details Name: Carla [name] MRN: [id number] DOB: 09/07/1936 Today's Date: 07/25/2015    History of Present Illness Pt is s/p fall with right distal femur periprosthetic fracture. Pt underwent IM femoral nailing on 07/22/15.    PT Comments    Pt motivated to participate but ltd by pain, obesity and NWB status of R LE.  Increased time and assist of 2-3 required for all mobility tasks this date including bed mobility, transfer to sitting at EOB, and standing with RW while maintaining NWB on R LE.  Follow Up Recommendations  SNF     Equipment Recommendations  None recommended by PT    Recommendations for Other Services OT consult     Precautions / Restrictions Precautions Precautions: Fall Precaution Comments: No ROM R knee Required Braces or Orthoses: Knee Immobilizer - Right Knee Immobilizer - Right: On at all times Restrictions Weight Bearing Restrictions: Yes RLE Weight Bearing: Non weight bearing    Mobility  Bed Mobility Overal bed mobility: +2 for physical assistance;+ 2 for safety/equipment;Needs Assistance Bed Mobility: Supine to Sit;Sit to Supine     Supine to sit: +2 for physical assistance;Max assist Sit to supine: Max assist;+2 for physical assistance   General bed mobility comments: Cues for sequence and use of L LE to self assist.  Physical assist to manage R LE, to bring trunk to upright and to rotate and bring pt to EOB on pad  Transfers Overall transfer level: Needs assistance Equipment used: Rolling walker (2 wheeled) Transfers: Sit to/from Stand Sit to Stand: Mod assist;+2 physical assistance;From elevated surface;+2 safety/equipment         General transfer comment: Utilized bed to bring pt up to 3/4 standing.  Mod assist x 2 to complete task and bring wt up and fwd over L LE.  Third person to manage R LE and insure NWB maintained  Ambulation/Gait Ambulation/Gait assistance: Mod assist;+2 safety/equipment;+2  physical assistance Ambulation Distance (Feet): 0 Feet Assistive device: Rolling walker (2 wheeled)       General Gait Details: Pt stood x ~2 min with RW and assist of 2 for balance, support and to insure NWB maintained   Stairs            Wheelchair Mobility    Modified Rankin (Stroke Patients Only)       Balance Overall balance assessment: Needs assistance Sitting-balance support: No upper extremity supported;Feet supported Sitting balance-Leahy Scale: Fair     Standing balance support: Bilateral upper extremity supported Standing balance-Leahy Scale: Poor                      Cognition Arousal/Alertness: Awake/alert Behavior During Therapy: WFL for tasks assessed/performed Overall Cognitive Status: Within Functional Limits for tasks assessed                      Exercises      General Comments        Pertinent Vitals/Pain Pain Assessment: Faces Faces Pain Scale: Hurts even more Pain Location: R knee Pain Descriptors / Indicators: Aching;Sore;Guarding Pain Intervention(s): Limited activity within patient's tolerance;Monitored during session;Premedicated before session    Home Living                      Prior Function            PT Goals (current goals can now be found in the care plan section) Acute Rehab PT Goals Patient Stated Goal: decrease  pain and move better. PT Goal Formulation: With patient Time For Goal Achievement: 08/06/15 Potential to Achieve Goals: Fair Progress towards PT goals: Progressing toward goals    Frequency  Min 3X/week    PT Plan Current plan remains appropriate    Co-evaluation             End of Session Equipment Utilized During Treatment: Gait belt;Right knee immobilizer Activity Tolerance: Patient limited by fatigue;Patient limited by pain Patient left: in bed;with call bell/phone within reach     Time: 1450-1528 PT Time Calculation (min) (ACUTE ONLY): 38 min  Charges:   $Therapeutic Activity: 38-52 mins                    G Codes:      Isabellah Sobocinski 2015/08/18, 4:44 PM

## 2015-07-26 ENCOUNTER — Encounter (HOSPITAL_COMMUNITY): Payer: Self-pay | Admitting: Orthopedic Surgery

## 2015-07-26 MED ORDER — POLYETHYLENE GLYCOL 3350 17 G PO PACK: 17.0000 g | PACK | Freq: Every day | ORAL | Status: DC | PRN

## 2015-07-26 MED ORDER — OXYCODONE HCL 5 MG PO TABS: 5.0000 mg | ORAL_TABLET | ORAL | Status: DC | PRN

## 2015-07-26 MED ORDER — DOCUSATE SODIUM 100 MG PO CAPS: 100.0000 mg | ORAL_CAPSULE | Freq: Two times a day (BID) | ORAL | Status: AC

## 2015-07-26 MED ORDER — FERROUS SULFATE 325 (65 FE) MG PO TABS: 325.0000 mg | ORAL_TABLET | Freq: Three times a day (TID) | ORAL | Status: AC

## 2015-07-26 MED ORDER — ACETAMINOPHEN 325 MG PO TABS: 650.0000 mg | ORAL_TABLET | Freq: Four times a day (QID) | ORAL | Status: AC | PRN

## 2015-07-26 MED ORDER — ENOXAPARIN SODIUM 40 MG/0.4ML ~~LOC~~ SOLN: 40.0000 mg | Freq: Every day | SUBCUTANEOUS | Status: DC

## 2015-07-26 NOTE — Care Management Important Message (Signed)
Important Message  Patient Details  Name: Carla [name] MRN: [id number] Date of Birth: 12/10/1935   Medicare Important Message Given:  Yes-second notification given    Haskell FlirtJamison, Develle Sievers 07/26/2015, 1:02 PMImportant Message  Patient Details  Name: Carla [name] MRN: [id number] Date of Birth: 01/31/1936   Medicare Important Message Given:  Yes-second notification given    Haskell FlirtJamison, Allaina Brotzman 07/26/2015, 1:02 PM

## 2015-07-26 NOTE — Progress Notes (Signed)
07/26/15 1600 Reviewed discharge instructions with patient. Patient verbalized understanding of discharge instructions. Copy of discharge instructions given to patient.

## 2015-07-26 NOTE — Progress Notes (Signed)
Patient ID: Carla [name], female   DOB: 09/11/1936, 79 y.o.   MRN: [id number] Subjective: 4 Days Post-Op Procedure(s) (LRB): INTRAMEDULLARY (IM) RETROGRADE FEMORAL NAILING (Right)    Patient reports pain as moderate.  Still limited by restrictions I set forth due to fracture pattern.  States she was out of bed yesterday. No events.  Foley still in  Objective:   VITALS:   Filed Vitals:   07/26/15 0939  BP: 105/53  Pulse: 100  Temp:   Resp:     Neurovascular intact Incision: dressing C/D/I  LABS  Recent Labs  07/24/15 0410 07/25/15 0508  HGB 6.9* 8.8*  HCT 20.7* 26.3*  WBC 10.7* 8.7  PLT 108* 121*     Recent Labs  07/24/15 0410 07/25/15 0508  NA 137 137  K 4.6 4.6  BUN 17 18  CREATININE 1.69* 1.43*  GLUCOSE 93 81    No results for input(s): LABPT, INR in the last 72 hours.   Assessment/Plan: 4 Days Post-Op Procedure(s) (LRB): INTRAMEDULLARY (IM) RETROGRADE FEMORAL NAILING (Right)   Advance diet Up with therapy Discharge to SNF today or tomorrow  D/C Foley prior to D/C

## 2015-07-26 NOTE — Progress Notes (Signed)
CSW continuing to follow.  [facility] confirmed with [facility] that facility can accept pt today.  CSW to facilitate pt discharge needs when discharge summary available.  CSW to continue to follow.  Loletta SpecterSuzanna Jinan Biggins, MSW, LCSW Clinical Social Work [phone]

## 2015-07-26 NOTE — Care Management Note (Signed)
Case Management Note  Patient Details  Name: Wynona CanesBettie Crehan MRN: [id number] Date of Birth: 04/24/1936  Subjective/Objective:                    Action/Plan:d/c SNF   Expected Discharge Date:                  Expected Discharge Plan:  Skilled Nursing Facility  In-House Referral:  Clinical Social Work  Discharge planning Services     Post Acute Care Choice:    Choice offered to:     DME Arranged:    DME Agency:     HH Arranged:    HH Agency:     Status of Service:  Completed, signed off  Medicare Important Message Given:    Date Medicare IM Given:    Medicare IM give by:    Date Additional Medicare IM Given:    Additional Medicare Important Message give by:     If discussed at Long Length of Stay Meetings, dates discussed:    Additional Comments:  Lanier ClamMahabir, Steve Gregg, RN 07/26/2015, 11:44 AM

## 2015-07-26 NOTE — Progress Notes (Signed)
Pt for discharge to [facility].   CSW facilitated pt discharge needs including contacting facility, faxing pt discharge information to facility, discussing with pt at bedside and pt daughter, Marcelino DusterMichelle via telephone, providing RN phone number to call report, and arranging ambulance transport via [facility] for pt to [facility].  No further social work needs identified at this time.  CSW signing off.   Loletta SpecterSuzanna Kidd, MSW, LCSW Clinical Social Work [phone]

## 2015-07-26 NOTE — Clinical Social Work Placement (Signed)
   CLINICAL SOCIAL WORK PLACEMENT  NOTE  Date:  07/26/2015  Patient Details  Name: Carla [name] MRN: [id number] Date of Birth: 10/29/1935  Clinical Social Work is seeking post-discharge placement for this patient at the Skilled  Nursing Facility level of care (*CSW will initial, date and re-position this form in  chart as items are completed):  Yes   Patient/family provided with [facility] Clinical Social Work Department's list of facilities offering this level of care within the geographic area requested by the patient (or if unable, by the patient's family).  Yes   Patient/family informed of their freedom to choose among providers that offer the needed level of care, that participate in Medicare, Medicaid or managed care program needed by the patient, have an available bed and are willing to accept the patient.  Yes   Patient/family informed of 's ownership interest in [facility] and [facility], as well as of the fact that they are under no obligation to receive care at these facilities.  PASRR submitted to EDS on       PASRR number received on       Existing PASRR number confirmed on 07/23/15     FL2 transmitted to all facilities in geographic area requested by pt/family on 07/23/15     FL2 transmitted to all facilities within larger geographic area on       Patient informed that his/her managed care company has contracts with or will negotiate with certain facilities, including the following:        Yes   Patient/family informed of bed offers received.  Patient chooses bed at Other - please specify in the comment section below: [facility])     Physician recommends and patient chooses bed at      Patient to be [facility] on 07/26/15.  Patient to be transferred to facility by ambulance [facility])     Patient family notified on 07/26/15 of transfer.  Name of family member notified:  pt notified at bedside and pt daughter, Marcelino DusterMichelle  notified via telephone.     PHYSICIAN Please sign FL2     Additional Comment:    _______________________________________________ [name], SUZANNA A, LCSW 07/26/2015, 2:34 PM

## 2015-07-26 NOTE — Discharge Summary (Signed)
Physician Discharge Summary  Patient ID: Carla [name] MRN: [id number] DOB/AGE: 77/10/1935 77 y.o.  Admit date: 07/22/2015 Discharge date:  07/26/2015  Procedures:  Procedure(s) (LRB): INTRAMEDULLARY (IM) RETROGRADE FEMORAL NAILING (Right)  Attending Physician:  Dr. Durene RomansMatthew Olin   Admission Diagnoses:   Right distal femur periprosthetic fracture   Discharge Diagnoses:  Principal Problem:   Periprosthetic fracture around internal prosthetic right knee joint [facility](HCC)  Past Medical History  Diagnosis Date  . Hypertension   . Depression     HPI:    Pt is a 79 y.o. female complaining of right knee pain. She was walking without her walker, fell and was not able to bear weight on the right leg. She was brought to the ER where she was diagnosis with a comminuted distal right femur periprosthetic fracture. She was then [facility] and seen by Dr. Charlann Boxerlin. He has discussed her hx, symptoms and x-rays. Discussed with the patient and family that she will need surgery to repair the area. They understand and would like to proceed.  PCP: No primary care provider on file.   Discharged Condition: good  Hospital Course:  Patient underwent the above stated procedure on 07/22/2015. Patient tolerated the procedure well and brought to the recovery room in good condition and subsequently to the floor.  POD #1 BP: 111/47 ; Pulse: 75 ; Temp: 98.2 F (36.8 C) ; Resp: 16 Patient reports pain as moderate, pain controlled. No events throughout the night. States that she really hasn't moved the leg much. Says she's a little upset with herself since she feels she has done this to her self, by not using her walker. Tried to stress she can only move forward and proceed as instructed to get allow this leg the best chance of healing. Dorsiflexion/plantar flexion intact, incision: dressing C/D/I, no cellulitis present and compartment soft.   LABS  Basename    HGB  8.4  HCT  24.7    POD #2  BP: 104/66 ; Pulse: 82 ; Temp: 98.4 F (36.9 C) ; Resp: 16 Pt c/o moderate pain to right hip and leg this morning.  Helped with change in position.  Denies any other symptoms or issues.  Patient reports pain as moderate.  Transfused with 2 units of blood. Right lower extremity in immobilizer, nv intact distally.  Mild to moderate pedal edema distally bilaterally  LABS  Basename    HGB  6.9  HCT  20.7   POD #3  BP: 118/46 ; Pulse: 94 ; Temp: 99.2 F (37.3 C) ; Resp: 16 Pt feeling a little better today.  Still c/o moderate pain to right hip.  Also c/o mild pain in right great toe, has hx of gout.  Patient reports pain as moderate. Right lower extremity in immobilizer, nv intact distally.  Normal exam of right great toe.  LABS  Basename    HGB  8.8  HCT  26.3   POD #4  BP: 105/53 ; Pulse: 100 ; Temp: 99 F (37.2 C) ; Resp: 16 Patient reports pain as moderate. Still limited by restrictions I set forth due to fracture pattern. States she was out of bed yesterday. No events.  D/C to SNF today. Neurovascular intact, Incision: dressing C/D/I.  LABS   No new labs   Discharge Exam: General appearance: alert, cooperative and no distress Extremities: Homans sign is negative, no sign of DVT, no edema, redness or tenderness in the calves or thighs and no ulcers, gangrene or trophic  changes  Disposition:  Skilled nursing facility with follow up in 2 weeks   Follow-up Information    Follow up with Shelda Pal, MD. Schedule an appointment as soon as possible for a visit in 2 weeks.   Specialty:  Orthopedic Surgery   Contact information:   [address] [place] [address] [phone]       Discharge Instructions    Call MD / Call 911    Complete by:  As directed   If you experience chest pain or shortness of breath, CALL 911 and be transported to the hospital emergency room.  If you develope a fever above 101 F, pus (white drainage) or increased  drainage or redness at the wound, or calf pain, call your surgeon's office.     Constipation Prevention    Complete by:  As directed   Drink plenty of fluids.  Prune juice may be helpful.  You may use a stool softener, such as Colace (over the counter) 100 mg twice a day.  Use MiraLax (over the counter) for constipation as needed.     Diet - low sodium heart healthy    Complete by:  As directed      Discharge instructions    Complete by:  As directed   Daily (and as needed) dressing changes with gauze and tape, keep the area dry and clean.  Follow up in 2 weeks at [facility]. Call with any questions or concerns.     Non weight bearing    Complete by:  As directed   Laterality:  right  Extremity:  Lower             Medication List    STOP taking these medications        aspirin EC 81 MG tablet     oxyCODONE-acetaminophen 5-325 MG tablet  Commonly known as:  PERCOCET/ROXICET      TAKE these medications        acetaminophen 325 MG tablet  Commonly known as:  TYLENOL  Take 2 tablets (650 mg total) by mouth every 6 (six) hours as needed for mild pain (or Fever >/= 101).     alendronate 70 MG tablet  Commonly known as:  FOSAMAX  Take 1 tablet by mouth once a week. wednesdays     allopurinol 100 MG tablet  Commonly known as:  ZYLOPRIM  Take 1 tablet by mouth daily.     Calcium Carb-Cholecalciferol 600-400 MG-UNIT Tabs  Take 1 tablet by mouth daily.     cetirizine 10 MG tablet  Commonly known as:  ZYRTEC  Take 10 mg by mouth daily.     cyanocobalamin 1000 MCG/ML injection  Commonly known as:  (VITAMIN B-12)  Inject 1,000 mcg into the muscle every 30 (thirty) days.     docusate sodium 100 MG capsule  Commonly known as:  COLACE  Take 1 capsule (100 mg total) by mouth 2 (two) times daily.     enoxaparin 40 MG/0.4ML injection  Commonly known as:  LOVENOX  Inject 0.4 mLs (40 mg total) into the skin daily.     ferrous sulfate 325 (65 FE) MG tablet  Take 1  tablet (325 mg total) by mouth 3 (three) times daily after meals.     furosemide 40 MG tablet  Commonly known as:  LASIX  Take 40 mg by mouth daily.     gabapentin 300 MG capsule  Commonly known as:  NEURONTIN  Take 300 mg by mouth  at bedtime.     guaiFENesin 600 MG 12 hr tablet  Commonly known as:  MUCINEX  Take 600 mg by mouth 2 (two) times daily.     levothyroxine 50 MCG tablet  Commonly known as:  SYNTHROID, LEVOTHROID  Take 50 mcg by mouth daily.     loperamide 2 MG tablet  Commonly known as:  IMODIUM A-D  Take 2 mg by mouth 3 (three) times daily as needed. diarrhea     losartan 50 MG tablet  Commonly known as:  COZAAR  Take 50 mg by mouth daily. for blood pressure     meclizine 25 MG tablet  Commonly known as:  ANTIVERT  Take 25 mg by mouth 2 (two) times daily as needed for dizziness.     Multiple Vitamins tablet  Take 1 tablet by mouth daily.     oxyCODONE 5 MG immediate release tablet  Commonly known as:  Oxy IR/ROXICODONE  Take 1-3 tablets (5-15 mg total) by mouth every 4 (four) hours as needed for breakthrough pain ((for MODERATE breakthrough pain)).     polyethylene glycol packet  Commonly known as:  MIRALAX / GLYCOLAX  Take 17 g by mouth daily as needed for mild constipation.     potassium chloride SA 20 MEQ tablet  Commonly known as:  K-DUR,KLOR-CON  Take 20 mEq by mouth daily. When taking furosemide     sertraline 50 MG tablet  Commonly known as:  ZOLOFT  Take 1 tablet by mouth daily as needed. depression     simvastatin 20 MG tablet  Commonly known as:  ZOCOR  Take 1 tablet by mouth daily.     sodium bicarbonate 650 MG tablet  Take 1 tablet by mouth 2 (two) times daily.     vitamin C 500 MG tablet  Commonly known as:  ASCORBIC ACID  Take 500 mg by mouth daily.         Signed: Anastasio Auerbach. Earlyn Sylvan   PA-C  07/26/2015, 10:28 AM

## 2015-07-26 NOTE — Progress Notes (Signed)
07/26/15  1600  Report called to [facility] [place] [facility].

## 2016-09-20 NOTE — Progress Notes (Signed)
Called pt for pre-op call and she states [facility]. I called Jacki ConesLaurie at Dr. Glenna Durandrtmann's office and she verified that pt will NOT be having surgery here. Jacki ConesLaurie states she has called the OR to cancel surgery.

## 2016-09-21 ENCOUNTER — Encounter (HOSPITAL_COMMUNITY): Admission: RE | Payer: Self-pay | Source: Ambulatory Visit

## 2016-09-21 ENCOUNTER — Ambulatory Visit (HOSPITAL_COMMUNITY): Admission: RE | Admit: 2016-09-21 | Payer: Medicare Other | Source: Ambulatory Visit | Admitting: Orthopedic Surgery

## 2016-09-21 SURGERY — OPEN REDUCTION INTERNAL FIXATION (ORIF) ULNAR FRACTURE
Anesthesia: Choice | Laterality: Left

## 2020-07-09 DEATH — deceased
